# Patient Record
Sex: Female | Born: 1965 | Race: White | Hispanic: No | Marital: Married | Smoking: Former smoker
Health system: Southern US, Community
[De-identification: ages and names within clinical notes are randomized; demographics above are authoritative.]

## PROBLEM LIST (undated history)

## (undated) DIAGNOSIS — E119 Type 2 diabetes mellitus without complications: Secondary | ICD-10-CM

## (undated) DIAGNOSIS — G2582 Stiff-man syndrome: Principal | ICD-10-CM

## (undated) DIAGNOSIS — I10 Essential (primary) hypertension: Secondary | ICD-10-CM

## (undated) HISTORY — PX: APPENDECTOMY: SHX54

## (undated) HISTORY — DX: Stiff-man syndrome: G25.82

## (undated) HISTORY — PX: HERNIA REPAIR: SHX51

## (undated) HISTORY — PX: CHOLECYSTECTOMY: SHX55

---

## 1998-02-11 ENCOUNTER — Other Ambulatory Visit: Admission: RE | Admit: 1998-02-11 | Discharge: 1998-02-11 | Payer: Self-pay | Admitting: Obstetrics and Gynecology

## 2000-03-14 ENCOUNTER — Other Ambulatory Visit: Admission: RE | Admit: 2000-03-14 | Discharge: 2000-03-14 | Payer: Self-pay | Admitting: *Deleted

## 2001-03-14 ENCOUNTER — Other Ambulatory Visit: Admission: RE | Admit: 2001-03-14 | Discharge: 2001-03-14 | Payer: Self-pay | Admitting: Obstetrics and Gynecology

## 2002-03-23 ENCOUNTER — Other Ambulatory Visit: Admission: RE | Admit: 2002-03-23 | Discharge: 2002-03-23 | Payer: Self-pay | Admitting: Obstetrics and Gynecology

## 2002-03-31 ENCOUNTER — Encounter: Admission: RE | Admit: 2002-03-31 | Discharge: 2002-03-31 | Payer: Self-pay | Admitting: Obstetrics and Gynecology

## 2002-03-31 ENCOUNTER — Encounter: Payer: Self-pay | Admitting: Obstetrics and Gynecology

## 2003-03-26 ENCOUNTER — Other Ambulatory Visit: Admission: RE | Admit: 2003-03-26 | Discharge: 2003-03-26 | Payer: Self-pay | Admitting: Obstetrics and Gynecology

## 2005-12-12 ENCOUNTER — Encounter: Admission: RE | Admit: 2005-12-12 | Discharge: 2005-12-12 | Payer: Self-pay | Admitting: Obstetrics and Gynecology

## 2007-01-09 ENCOUNTER — Encounter: Admission: RE | Admit: 2007-01-09 | Discharge: 2007-01-09 | Payer: Self-pay | Admitting: Obstetrics and Gynecology

## 2008-01-15 ENCOUNTER — Encounter: Admission: RE | Admit: 2008-01-15 | Discharge: 2008-01-15 | Payer: Self-pay | Admitting: Obstetrics and Gynecology

## 2008-06-24 ENCOUNTER — Emergency Department (HOSPITAL_COMMUNITY): Admission: EM | Admit: 2008-06-24 | Discharge: 2008-06-25 | Payer: Self-pay | Admitting: Emergency Medicine

## 2008-06-25 ENCOUNTER — Encounter: Admission: RE | Admit: 2008-06-25 | Discharge: 2008-06-25 | Payer: Self-pay | Admitting: Family Medicine

## 2008-06-26 ENCOUNTER — Observation Stay (HOSPITAL_COMMUNITY): Admission: EM | Admit: 2008-06-26 | Discharge: 2008-06-27 | Payer: Self-pay | Admitting: Internal Medicine

## 2008-07-22 ENCOUNTER — Encounter (INDEPENDENT_AMBULATORY_CARE_PROVIDER_SITE_OTHER): Payer: Self-pay | Admitting: General Surgery

## 2008-07-22 ENCOUNTER — Ambulatory Visit (HOSPITAL_COMMUNITY): Admission: RE | Admit: 2008-07-22 | Discharge: 2008-07-22 | Payer: Self-pay | Admitting: General Surgery

## 2008-09-10 ENCOUNTER — Ambulatory Visit (HOSPITAL_COMMUNITY): Admission: EM | Admit: 2008-09-10 | Discharge: 2008-09-12 | Payer: Self-pay | Admitting: Emergency Medicine

## 2008-09-10 ENCOUNTER — Encounter (INDEPENDENT_AMBULATORY_CARE_PROVIDER_SITE_OTHER): Payer: Self-pay | Admitting: Surgery

## 2009-01-17 ENCOUNTER — Encounter: Admission: RE | Admit: 2009-01-17 | Discharge: 2009-01-17 | Payer: Self-pay | Admitting: Obstetrics and Gynecology

## 2009-05-24 ENCOUNTER — Inpatient Hospital Stay (HOSPITAL_COMMUNITY): Admission: AD | Admit: 2009-05-24 | Discharge: 2009-05-26 | Payer: Self-pay | Admitting: General Surgery

## 2009-05-24 ENCOUNTER — Ambulatory Visit: Payer: Self-pay | Admitting: Surgery

## 2009-05-24 ENCOUNTER — Encounter (INDEPENDENT_AMBULATORY_CARE_PROVIDER_SITE_OTHER): Payer: Self-pay | Admitting: General Surgery

## 2009-10-25 ENCOUNTER — Encounter: Admission: RE | Admit: 2009-10-25 | Discharge: 2009-11-21 | Payer: Self-pay | Admitting: Family Medicine

## 2010-04-24 ENCOUNTER — Encounter: Payer: Self-pay | Admitting: Internal Medicine

## 2010-04-24 ENCOUNTER — Emergency Department (HOSPITAL_COMMUNITY): Admission: EM | Admit: 2010-04-24 | Discharge: 2010-04-24 | Payer: Self-pay | Admitting: Emergency Medicine

## 2010-04-24 ENCOUNTER — Ambulatory Visit: Payer: Self-pay | Admitting: Cardiology

## 2010-05-04 ENCOUNTER — Encounter: Payer: Self-pay | Admitting: Internal Medicine

## 2010-05-30 ENCOUNTER — Telehealth (INDEPENDENT_AMBULATORY_CARE_PROVIDER_SITE_OTHER): Payer: Self-pay | Admitting: *Deleted

## 2010-06-21 ENCOUNTER — Ambulatory Visit: Payer: Self-pay | Admitting: Internal Medicine

## 2010-06-21 DIAGNOSIS — R9431 Abnormal electrocardiogram [ECG] [EKG]: Secondary | ICD-10-CM

## 2010-06-21 DIAGNOSIS — I10 Essential (primary) hypertension: Secondary | ICD-10-CM

## 2010-09-25 ENCOUNTER — Telehealth: Payer: Self-pay | Admitting: Internal Medicine

## 2010-10-03 NOTE — Letter (Signed)
Summary: [facility]   [facility]   Imported By: Roderic Ovens 07/14/2010 14:27:08  _____________________________________________________________________  External Attachment:    Type:   Image     Comment:   External Document

## 2010-10-03 NOTE — Consult Note (Signed)
Summary: [place]   [place]   Imported By: Roderic Ovens 07/14/2010 14:27:47  _____________________________________________________________________  External Attachment:    Type:   Image     Comment:   External Document

## 2010-10-03 NOTE — Assessment & Plan Note (Signed)
Summary: nep. tackycardia, pt has bcbs/ gd   Visit Type:  NP Primary Provider:  Dr. Paulino Rily  CC:  dizziness, light headed, and .  History of Present Illness: Ms. Bessinger is seen at the request of Dr. Paulino Rily because of elevated blood pressure and tachycardia.  The patient is a 45 year old woman who began having problems of tachycardia palpitations about a year ago. This followed a series of 3 abdominal surgeries include a laparoscopic cholecystectomy, laparoscopic appendectomy, and a ventral hernia/inguinal hernia repair. Since that time she's had problems with recurrent spasms resulting in significant pain difficulty with standing erect. It is with these spasms that she notes that her heart rate and blood pressure are elevated.  She has been treated and her currently with Valium with up titration to 5 mg a day which has been associated with a marked improvement. She has also been submitted to physical therapy which helped some.  The family brings in a notebook today with heart rates and blood pressures recorded at home over the last month or so; they are all without exception normal heart rate in the 60s and 70s and blood pressures in the 110-120 range.  The patient denies a history of syncope. She notes that she has less lightheadedness when she is.  She has significant stiffness following her spasm problem over the last year or so and this is the major limitation to her exercise tolerance.  Preventive Screening-Counseling & Management  Alcohol-Tobacco     Smoking Status: quit  Caffeine-Diet-Exercise     Does Patient Exercise: yes  Current Medications (verified): 1)  Zebeta 5 Mg Tabs (Bisoprolol Fumarate) .... 1/2 Tablet Daily 2)  Diazepam 5 Mg/ml Soln (Diazepam) .... Once Daily 3)  Vimovo 500-20 Mg Tbec (Naproxen-Esomeprazole) .... Once Daily 4)  One Daily For Women  Tabs (Multiple Vitamins-Minerals) .... Once Daily  Allergies (verified): No Known Drug Allergies  Past  History:  Past Medical History: Last updated: 06/20/2010 Abdominal hernia Sinus tachycardia HTN hepatitis  Family History: Last updated: 06/20/2010 Non-contributory  Social History: Last updated: 06/21/2010 Married  Tobacco Use - Former. -quit smoking in 2006 Alcohol Use - no Tobacco Use - Former.  Regular Exercise - yes  Past Surgical History: laparoscopic cholecystectomy laparoscopic appendectomy ventral hernia repair C-section  Social History: Married  Tobacco Use - Former. -quit smoking in 2006 Alcohol Use - no Tobacco Use - Former.  Regular Exercise - yes Does Patient Exercise:  yes  Review of Systems       full review of systems was negative apart from a history of present illness and past medical history except bursitis  Vital Signs:  Patient profile:   45 year old female Height:      65 inches Weight:      219.25 pounds BMI:     36.62 Pulse rate:   102 / minute BP sitting:   136 / 86  (left arm) Cuff size:   large  Vitals Entered By: Caralee Ates CMA (June 21, 2010 2:42 PM)  Physical Exam  General:  Alert and oriented middle aged Caucasian female appearing her stated age in no acute distress. HEENT  normal . Neck veins were flat; carotids brisk and full without bruits. No lymphadenopathy. Back without kyphosis. Lungs clear. Heart sounds regular without murmurs or gallops. PMI nondisplaced. Abdomen soft with active bowel sounds without midline pulsation or hepatomegaly. Femoral pulses and distal pulses intact. Extremities were without clubbing cyanosis or edemaSkin warm and dry. Neurological exam grossly normal, but she  ambulates with great difficulty and slowness affect is somewhat flat   Impression & Recommendations:  Problem # 1:  HYPERTENSION, BENIGN (ICD-401.1) this appears to be largely sympathetic driven associated with spasms as evidenced by the home recordings which are all normal. I have encouraged her to followup with her primary care  physician for further physical therapy and other things that might be done to help ameliorate her pain. This also holds true for the tachycardia. There is no evidence for an elective cardiogram that this represents an abnormal rhythm. Her updated medication list for this problem includes:    Zebeta 5 Mg Tabs (Bisoprolol fumarate) .Marland Kitchen... 1/2 tablet daily  Problem # 2:  ELECTROCARDIOGRAM, ABNORMAL (ICD-794.31) Head he said the above, however, I'm a little bit bothered by the abnormalities on her electrocardiogram and would suggest that we exclude coronary disease as a potential contributor to her exercise intolerance. This will need to be done with [facility] imaging as she is unable to ambulate. Her updated medication list for this problem includes:    Zebeta 5 Mg Tabs (Bisoprolol fumarate) .Marland Kitchen... 1/2 tablet daily  Appended Document: orders

## 2010-10-03 NOTE — Letter (Signed)
Summary: [facility]   [facility]   Imported By: Roderic Ovens 07/14/2010 14:26:50  _____________________________________________________________________  External Attachment:    Type:   Image     Comment:   External Document

## 2010-10-03 NOTE — Progress Notes (Signed)
  Records recieved from [facility] NP appt w/ Graciela Husbands 06/21/10 given to Simeon Craft [place]  May 30, 2010 9:38 AM

## 2010-10-05 NOTE — Progress Notes (Signed)
  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  she is getting a second opinion later this year and if it is different will notifiy [place].  Initial call taken by: Claris Gladden RN,  September 25, 2010 3:43 PM

## 2010-11-17 LAB — POCT I-STAT, CHEM 8
BUN: 16 mg/dL (ref 6–23)
Calcium, Ion: 1.04 mmol/L — ABNORMAL LOW (ref 1.12–1.32)
Chloride: 111 mEq/L (ref 96–112)
Creatinine, Ser: 0.8 mg/dL (ref 0.4–1.2)
Glucose, Bld: 112 mg/dL — ABNORMAL HIGH (ref 70–99)
Potassium: 4.2 mEq/L (ref 3.5–5.1)

## 2010-11-17 LAB — URINALYSIS, ROUTINE W REFLEX MICROSCOPIC
Bilirubin Urine: NEGATIVE
Leukocytes, UA: NEGATIVE
Nitrite: NEGATIVE
Protein, ur: 30 mg/dL — AB

## 2010-11-17 LAB — CBC
HCT: 38 % (ref 36.0–46.0)
Hemoglobin: 13.4 g/dL (ref 12.0–15.0)
MCHC: 35.3 g/dL (ref 30.0–36.0)
MCV: 85.7 fL (ref 78.0–100.0)
RDW: 13.5 % (ref 11.5–15.5)
WBC: 13.1 10*3/uL — ABNORMAL HIGH (ref 4.0–10.5)

## 2010-11-17 LAB — PROTIME-INR: Prothrombin Time: 14.2 seconds (ref 11.6–15.2)

## 2010-11-17 LAB — DIFFERENTIAL
Basophils Absolute: 0 10*3/uL (ref 0.0–0.1)
Eosinophils Relative: 1 % (ref 0–5)
Lymphocytes Relative: 12 % (ref 12–46)
Monocytes Absolute: 0.9 10*3/uL (ref 0.1–1.0)
Monocytes Relative: 7 % (ref 3–12)
Neutro Abs: 10.4 10*3/uL — ABNORMAL HIGH (ref 1.7–7.7)

## 2010-11-17 LAB — POCT CARDIAC MARKERS
CKMB, poc: 1.7 ng/mL (ref 1.0–8.0)
CKMB, poc: <1 ng/mL — ABNORMAL LOW (ref 1.0–8.0)
Myoglobin, poc: 149 ng/mL (ref 12–200)
Troponin i, poc: <0.05 ng/mL (ref 0.00–0.09)

## 2010-11-17 LAB — POCT PREGNANCY, URINE: Preg Test, Ur: NEGATIVE

## 2010-11-17 LAB — HEPATIC FUNCTION PANEL
AST: 23 U/L (ref 0–37)
Albumin: 4.6 g/dL (ref 3.5–5.2)
Alkaline Phosphatase: 65 U/L (ref 39–117)
Bilirubin, Direct: 0.2 mg/dL (ref 0.0–0.3)
Total Bilirubin: 0.8 mg/dL (ref 0.3–1.2)

## 2010-11-17 LAB — URINE MICROSCOPIC-ADD ON

## 2010-12-08 LAB — URINALYSIS, ROUTINE W REFLEX MICROSCOPIC
Glucose, UA: NEGATIVE mg/dL
pH: 5.5 (ref 5.0–8.0)

## 2010-12-08 LAB — BASIC METABOLIC PANEL
CO2: 27 mEq/L (ref 19–32)
Chloride: 106 mEq/L (ref 96–112)
Creatinine, Ser: 0.56 mg/dL (ref 0.4–1.2)
GFR calc Af Amer: >60 mL/min (ref >60)
Glucose, Bld: 112 mg/dL — ABNORMAL HIGH (ref 70–99)
Potassium: 3.9 mEq/L (ref 3.5–5.1)
Potassium: 3.9 mEq/L (ref 3.5–5.1)
Sodium: 138 mEq/L (ref 135–145)

## 2010-12-08 LAB — COMPREHENSIVE METABOLIC PANEL
AST: 29 U/L (ref 0–37)
CO2: 26 mEq/L (ref 19–32)
Calcium: 9.6 mg/dL (ref 8.4–10.5)
Creatinine, Ser: 0.64 mg/dL (ref 0.4–1.2)
GFR calc Af Amer: >60 mL/min (ref >60)
GFR calc non Af Amer: >60 mL/min (ref >60)
Sodium: 140 mEq/L (ref 135–145)
Total Protein: 7.7 g/dL (ref 6.0–8.3)

## 2010-12-08 LAB — DIFFERENTIAL
Eosinophils Absolute: 0.3 10*3/uL (ref 0.0–0.7)
Eosinophils Relative: 4 % (ref 0–5)
Lymphocytes Relative: 31 % (ref 12–46)
Lymphs Abs: 1.7 10*3/uL (ref 0.7–4.0)
Lymphs Abs: 2.2 10*3/uL (ref 0.7–4.0)
Monocytes Relative: 11 % (ref 3–12)
Monocytes Relative: 9 % (ref 3–12)
Neutro Abs: 5.7 10*3/uL (ref 1.7–7.7)
Neutrophils Relative %: 56 % (ref 43–77)
Neutrophils Relative %: 65 % (ref 43–77)

## 2010-12-08 LAB — CBC
MCHC: 34 g/dL (ref 30.0–36.0)
MCV: 89.1 fL (ref 78.0–100.0)
MCV: 89.1 fL (ref 78.0–100.0)
Platelets: 306 10*3/uL (ref 150–400)
RBC: 3.59 MIL/uL — ABNORMAL LOW (ref 3.87–5.11)
RBC: 4.27 MIL/uL (ref 3.87–5.11)
RDW: 12.9 % (ref 11.5–15.5)
WBC: 8.9 10*3/uL (ref 4.0–10.5)

## 2010-12-08 LAB — URINE MICROSCOPIC-ADD ON

## 2010-12-18 LAB — CBC
HCT: 38.9 % (ref 36.0–46.0)
Hemoglobin: 13.1 g/dL (ref 12.0–15.0)
MCHC: 33.8 g/dL (ref 30.0–36.0)
MCV: 89.6 fL (ref 78.0–100.0)
RDW: 12.9 % (ref 11.5–15.5)

## 2010-12-18 LAB — DIFFERENTIAL
Basophils Absolute: 0 10*3/uL (ref 0.0–0.1)
Basophils Relative: 0 % (ref 0–1)
Eosinophils Relative: 0 % (ref 0–5)
Monocytes Absolute: 1.1 10*3/uL — ABNORMAL HIGH (ref 0.1–1.0)

## 2010-12-18 LAB — URINALYSIS, ROUTINE W REFLEX MICROSCOPIC
Bilirubin Urine: NEGATIVE
Protein, ur: NEGATIVE mg/dL
Urobilinogen, UA: 0.2 mg/dL (ref 0.0–1.0)

## 2010-12-18 LAB — URINE MICROSCOPIC-ADD ON

## 2010-12-18 LAB — PROTIME-INR: INR: 1.2 (ref 0.00–1.49)

## 2010-12-18 LAB — APTT: aPTT: 33 seconds (ref 24–37)

## 2011-01-16 NOTE — Op Note (Signed)
Margaret Boyd, Margaret Boyd              ACCOUNT NO.:  [id number]   MEDICAL RECORD NO.:  [id number]          PATIENT TYPE:  AMB   LOCATION:  SDS                          FACILITY:  [facility]   PHYSICIAN:  Juanetta Gosling, MDDATE OF BIRTH:  1966-08-09   DATE OF PROCEDURE:  07/22/2008  DATE OF DISCHARGE:  07/22/2008                               OPERATIVE REPORT   PREOPERATIVE DIAGNOSES:  1. Episode of gallstone pancreatitis.  2. Symptomatic cholelithiasis.   POSTOPERATIVE DIAGNOSIS:  Acute cholecystitis.   PROCEDURE:  Laparoscopic cholecystectomy.   SURGEON:  Troy Sine. Dwain Sarna, MD   ASSISTANT:  Ms. Magnus Ivan.   ANESTHESIA:  General.   FINDINGS:  Unable to cannulate cystic duct due to size and was not able  to perform a cholangiogram safely.   SPECIMENS:  Gallbladder contents to pathology.   ESTIMATED BLOOD LOSS:  Minimal.   COMPLICATIONS:  None.   DRAINS:  None.   DISPOSITION:  To PACU in a stable condition.   INDICATIONS:  Margaret Boyd is a 45 year old female with a history of  epigastric pain, evaluated by her primary care physician and found to  have an elevated lipase.  She was admitted after this outpatient  evaluation.  Underwent a CT scan that was essentially normal.  An  ultrasound that showed cholelithiasis with a mildly thickened  gallbladder wall, multiple shadow with gallstones that are small.  No  pericholecystic fluid.  Common duct was 4.2 mm.  Her LFTs were mildly  elevated with an AST of 72 and ALT of 389.  Normal total bilirubin and a  normal alkaline phosphatase.  She was discharged and was sent to see me  in clinic.  I counseled her for a laparoscopic cholecystectomy with a  cholangiogram.   PROCEDURE:  After informed consent was obtained, the patient was taken  to the operating room.  She was administered 1 g of cefoxitin.  Sequential compression devices were placed over the lower extremities.  She then underwent general endotracheal anesthesia  without complication.  Her abdomen was then prepped and draped in a standard sterile surgical  fashion.  Surgical time-out was then performed.   A 10-mm vertical infraumbilical incision was then made and dissection  carried out down to the level of the fascia.  This was then entered  sharply and the peritoneum was then entered bluntly.  There was no  evidence of injury upon entry.  A Hasson trocar was then inserted after  a Vicryl pursestring suture was placed through the fascia.  The abdomen  was insufflated to 15 mmHg pressure, which she tolerated well.  Three  further 5-mm trocars were inserted into the epigastrium and the right  upper quadrant after infiltration with local anesthetic under direct  vision without complication.  Following this, the gallbladder was then  retracted cephalad.  There was some omentum that was adherent to the  gallbladder that was dissected bluntly and the gallbladder was then  grasped lower down and retracted lateral as well as cephalad.  The  triangle of Calot was then dissected.  There was a fair amount of  scarring  located in the triangle of Calot.  The hook cautery was used to  release both medial and lateral and the gallbladder.  There was a rind  around the wall that was quite thick.  Following this, a [place] was then used to dissect the triangle.  The cystic duct and  cystic artery were both clearly identified entering the gallbladder with  liver on either side of those obtaining the critical view of safety.  Following this, I placed a clip distally on the cystic duct and then  made a ductotomy with the scissors.  I put the tip of the hook cautery  in there and I obtained a small amount of bilious material.  I then  placed a Cook catheter inside the abdomen and tried to put this inside  the cystic duct and was unable to.  I tried to milk some of stones out  of the cystic duct, but there were none present.  I could clearly see  the  cystic-common duct junction as well as the aforementioned critical  view of safety.  I tried numerous times to cannulate the cystic duct  also to milk out any material that might have been in the cystic duct  but was unable to do this.  I eventually thought that there was no play  I could make a hole further down as I would be begin to get closer to  the common duct.  I elected at this point since I obtained a critical  view of safety to put 3 clips proximally and divide the cystic duct,  which I did without complication.  I then put 3 clips on the cystic  artery, cut this, and then began to remove the gallbladder from the  liver bed with electrocautery.  This had a very watery plane stuck in at  some places to the gallbladder bed.  I made one entry into the liver,  which I controlled with electrocautery.  The gallbladder was then  removed from the liver bed.  The 5-mm camera was inserted in the  epigastrium and the EndoCatch was inserted into the umbilical port.  The  gallbladder was placed in the EndoCatch and removed and passed off the  table as a specimen.  The Hasson trocar was then reinserted.  The 10-mm  camera was reinserted.  The area was copiously irrigated.  Hemostasis  was observed.  All clips were in good position.  I then removed all the  trocars under direct vision and desufflated the abdomen.  I placed one  extra 0 Vicryl stitch in her fascia at her umbilicus and then all skin  incisions were closed with a 4-0 Monocryl in a subcuticular fashion.  Dermabond was then placed over the wound.  She was extubated in the  operating room and transferred to the PACU in a stable condition.      Juanetta Gosling, MD  Electronically Signed     MCW/MEDQ  D:  07/22/2008  T:  07/23/2008  Job:  [id number]   cc:   Emeterio Reeve, MD

## 2011-01-16 NOTE — H&P (Signed)
[name], Margaret NO.:  [id number]   MEDICAL RECORD NO.:  [id number]          PATIENT TYPE:  INP   LOCATION:  5012                         FACILITY:  [facility]   PHYSICIAN:  Corinna L. Lendell Caprice, MDDATE OF BIRTH:  1966/04/19   DATE OF ADMISSION:  06/26/2008  DATE OF DISCHARGE:                              HISTORY & PHYSICAL   CHIEF COMPLAINT:  Abdominal pain.   HISTORY OF PRESENT ILLNESS:  Margaret Boyd is a pleasant 45 year old white  female who saw Dr. Paulino Rily for the first time over the past week for  epigastric pain.  She reports that she has experienced mainly nighttime  epigastric pain that radiates to her back for the past several weeks.  Her pain was so bad that she came to the Emergency Room at [facility]  on Thursday, but left prior to being evaluated due to a long wait time.  The patient reports that she has loose stool in the mornings.  No  steatorrhea.  She has had a poor appetite occasionally.  She has not had  any nausea or vomiting , however.  She was seen by Dr. Paulino Rily several  days ago and had a lipase drawn, which was 1722.  This was on June 24, 2008.  She also had an abdominal ultrasound at [facility] on  June 25, 2008 which showed cholelithiasis and mildly thickened  gallbladder wall, no pericholecystic fluid or Murphy sign, slight  increase in liver echogenicity, normal kidneys, normal common duct  diameter and the pancreas appeared normal.  Dr. Paulino Rily contacted Dr.  Janee Morn today for direct admission to [facility].  The patient  denies any jaundice.  Currently, she has no pain or nausea.  She reports  that it is usually at night time and lasts for 5 hours or so.  She has  tried Tums, Maalox, ex-lax without any relief of her pain.  It is  unrelated to eating.  It is described as pressure/boring-type pain, but  no burning or reflux symptoms.  She has had no shortness of breath.  No  cough.  She feels bloated when these  experiences occur.  She also has  been going to a [facility] called [facility] in [place].  She does not recall the names of the supplements and medications, but  describes what may be phendimetrazine which is a sympathomimetic and  pharmacologically related to amphetamine according to the PDR.  I have  also looked on the website for these mediclinics and it sounds as if she  is on one of their fat burner supplements.  The ingredients are not  listed.  She is also on calcium product, which according to their  website contains calcium, magnesium, and vitamin D3.  She has been on  these medications for about 5 months and has lost about 50 pounds.   PAST MEDICAL HISTORY:  None.   MEDICATIONS:  She was recently started back on Depo-Provera for severe  premenstrual clamping and pain.  Also as above.   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  She is married.  She has 2 daughters, age 49 and 81.  She does not drink  heavily.  She does not use drugs.  She does not smoke.   FAMILY HISTORY:  She has 2 healthy living parents and her siblings are  without any medical problems.   PAST SURGICAL HISTORY:  C-section.   REVIEW OF SYSTEMS:  As above, otherwise negative.   PHYSICAL EXAMINATION:  VITAL SIGNS:  Her vital signs have not yet been  entered.  Please see nursing notes.  GENERAL:  The patient is well nourished, well developed in no acute  distress.  HEENT:  Normocephalic and atraumatic.  Pupils are equal, round, and  reactive to light.  Sclerae are nonicteric.  Moist mucous membranes.  NECK:  Supple.  No lymphadenopathy.  LUNGS:  Clear to auscultation bilaterally without wheezes, rhonchi, or  rales.  CARDIOVASCULAR:  Regular rate and rhythm without murmurs, gallops, or  rubs.  ABDOMEN:  Soft, mild epigastric tenderness, but no rebound tenderness.  No guarding.  BACK:  No CVA tenderness.  EXTREMITIES:  No clubbing, cyanosis, or edema.   SKIN:  No rash.  No jaundice.  PSYCHIATRIC:  Normal affect.  NEUROLOGIC:  Alert and oriented.  Cranial nerves and sensorimotor exam  are intact.   LABS:  Lipase from 2 days ago was 1722.  CBC today is unremarkable.  Complete metabolic panel today is significant for an SGOT of 154, SGPT  of 601, otherwise normal bilirubin and alkaline phosphatase.  Her  amylase is 70 and lipase is pending.  Ultrasound as above.   ASSESSMENT AND PLAN:  1. Pancreatitis with elevated transaminases:  I suspect she may have      had gallstone pancreatitis that is possibly improving.  The      ultrasound did show cholelithiasis, but no common duct stone or      dilatation.  She also had no description of pancreas edema on the      ultrasound.  I will await results of the lipase.  I have also asked      that the husband bring in her weight loss medications and      supplements as this may be a causative agent as well.  The patient      for now will be on a low-fat diet.  I will at this time get a CT of      the abdomen and pelvis to further evaluate.  I will also check a      hepatitis screen and urine drug screen as well as acetaminophen      level.  I will also check fasting triglyceride level.  2. Increased liver function test, see above.  This will be monitored      with serial laboratories and CAT scan.  If her liver function test      remain elevated and if the cause is elusive, she may need further      workup like autoimmune workup, etc.      Corinna L. Lendell Caprice, MD  Electronically Signed     CLS/MEDQ  D:  06/26/2008  T:  06/27/2008  Job:  [id number]   cc:   Emeterio Reeve, MD

## 2011-01-16 NOTE — Op Note (Signed)
NAMEQUETZALI, HEINLE NO.:  [id number]   MEDICAL RECORD NO.:  [id number]          PATIENT TYPE:  INP   LOCATION:  1519                         FACILITY:  [facility]   PHYSICIAN:  Velora Heckler, MD      DATE OF BIRTH:  29-Mar-1966   DATE OF PROCEDURE:  09/10/2008  DATE OF DISCHARGE:                               OPERATIVE REPORT   PREOPERATIVE DIAGNOSIS:  Acute appendicitis.   POSTOPERATIVE DIAGNOSIS:  Acute appendicitis.   PROCEDURE:  Laparoscopic appendectomy.   SURGEON:  Velora Heckler, MD, FACS   ANESTHESIA:  General per Dr. Lestine Box   ESTIMATED BLOOD LOSS:  Minimal.   PREPARATION:  Betadine.   COMPLICATIONS:  None.   INDICATIONS:  The patient is a 45 year old white female from  [place], [place] who presents to the emergency department  with a 24-hour history of abdominal pain localizing to the right lower  quadrant associated with nausea and vomiting.  Laboratory studies showed  an elevated white blood cell count of 17,000.  CT scan abdomen and  pelvis showed findings consistent with acute appendicitis.  The patient  is prepared and brought to the operating room for appendectomy.   BODY OF REPORT:  Procedure is done in OR #1 at the [facility]  The patient is brought to the operating room, placed in  supine position on the operating room table.  Following administration  of general anesthesia, the patient is prepped and draped in usual strict  aseptic fashion.  After ascertaining that an adequate level of  anesthesia had been achieved, a supraumbilical incision is made in the  midline with a #15 blade.  Dissection was carried down to the fascia.  Fascia is incised in the midline and the peritoneal cavity was entered  cautiously and 0 Zero Vicryl pursestring suture was placed on the  fascia.  An Hasson cannula was introduced under direct vision and  secured with a pursestring suture.  Abdomen was insufflated with  carbon  dioxide.  Laparoscope was introduced and the abdomen explored.  There  are adhesions to the previous umbilical incision.  Operative port was  placed in the right upper quadrant.  Adhesions were taken down with the  harmonic scalpel.  Using a Glassman clamp, the right colon is mobilized.  Terminal ileum appears normal.  Colon appears normal.  Appendix was  quite long and distended.  It lies in the lateral colic gutter in a  partially retrocecal location.  Operative port was placed in a  subxiphoid position.  Appendix was elevated and, using the harmonic  scalpel, the peritoneum was incised.  Mesoappendix was divided with the  harmonic scalpel.  Dissection was carefully carried down to the base of  the appendix which was then transected with an Endo-GIA stapler.  Staple  line shows good hemostasis.  The appendix was placed into an EndoCatch  bag and withdrawn through the umbilical port without difficulty and 0  Vicryl pursestring sutures tied securely.  Staple line was again  inspected and good hemostasis was noted.  Good hemostasis was noted  along  the lateral colic gutter.  No evidence of abscess or perforation  was identified.  Pneumoperitoneum was released and ports were removed  under direct vision.  Port sites were anesthetized with local  anesthetic.  All  wounds were closed with interrupted 4-0 Vicryl subcuticular sutures.  Wounds were washed and dried and Benzoin and Steri-Strips were applied.  Sterile dressings were applied.  The patient is awakened from anesthesia  and brought to the recovery room in stable condition.  The patient  tolerated the procedure well.      Velora Heckler, MD  Electronically Signed     TMG/MEDQ  D:  09/10/2008  T:  09/11/2008  Job:  [id number]   cc:   Neta Mends. Fabian Sharp, MD  [address] [place], [place] [address]

## 2011-01-16 NOTE — Discharge Summary (Signed)
Margaret Boyd, Margaret Boyd              ACCOUNT NO.:  [id number]   MEDICAL RECORD NO.:  [id number]          PATIENT TYPE:  INP   LOCATION:  1519                         FACILITY:  [facility]   PHYSICIAN:  Velora Heckler, MD      DATE OF BIRTH:  [birth year]   DATE OF ADMISSION:  09/10/2008  DATE OF DISCHARGE:  09/12/2008                               DISCHARGE SUMMARY   REASON FOR ADMISSION:  Acute appendicitis.   HISTORY OF PRESENT ILLNESS:  Margaret Boyd is a 6-year year-old white  female from [place], [place].  She presented to the emergency  room with abdominal pain, nausea, vomiting and chills.  Laboratory  studies showed an elevated white blood cell count of 17,000.  CT scan  abdomen and pelvis at [facility] showed findings  consistent with acute appendicitis.  General surgery was called and the  patient was admitted for management.   HOSPITAL COURSE:  The patient was admitted on January 8 to the general  surgical service.  She was prepared and taken immediately to the  operating room where she underwent laparoscopic appendectomy.  Postoperative course was uncomplicated.  She received 24 hours of  intravenous antibiotics.  She her diet was advanced from clear liquids  to a regular diet.  She is prepared for discharge home on the second  postoperative day.   DISCHARGE/PLAN:  The patient is discharged home on September 12, 2008.  She is tolerating regular diet.  She is ambulating independently.   DISCHARGE MEDICATIONS:  Include Vicodin as needed for pain.   FOLLOWUP:  The patient will be seen back in my office at [facility] in 2 to 3 weeks for wound check.   FINAL DIAGNOSIS:  Acute appendicitis.   CONDITION ON DISCHARGE:  Improved.      Velora Heckler, MD  Electronically Signed     TMG/MEDQ  D:  09/12/2008  T:  09/12/2008  Job:  [id number]   cc:   Juanetta Gosling, MD  [address]  [place] [address]

## 2011-01-16 NOTE — H&P (Signed)
NAMESHANTERRIA, FRANTA NO.:  [id number]   MEDICAL RECORD NO.:  [id number]          PATIENT TYPE:  INP   LOCATION:  1519                         FACILITY:  [facility]   PHYSICIAN:  Velora Heckler, MD      DATE OF BIRTH:  07-26-1966   DATE OF ADMISSION:  09/10/2008  DATE OF DISCHARGE:                              HISTORY & PHYSICAL   REFERRING PHYSICIAN:  Emergency Department, [facility].   CHIEF COMPLAINT:  Abdominal pain, nausea, vomiting and chills; CT scan  showing acute appendicitis.   HISTORY OF PRESENT ILLNESS:  Margaret Boyd is a 45 year old white  female from [place], [place].  She presents to the emergency  room with 24-hour history of abdominal pain localizing to the right  lower quadrant associated with nausea, vomiting and chills.  The patient  was seen earlier today by Dr. Emelia Loron at our office.  Laboratory studies were obtained and showed an elevated white blood cell  count of 17,000 with left shift with 89% segmented neutrophils.  CT scan  abdomen and pelvis was obtained at [facility]  demonstrating findings consistent with acute appendicitis.  Patient is  now seen by general surgery in preparation for appendectomy.   PAST MEDICAL HISTORY:  1. Status post laparoscopic cholecystectomy November 2009.  2. Status post cesarean section.   MEDICATIONS:  None.   ALLERGIES:  None known.   SOCIAL HISTORY:  The patient is married.  She is a Futures trader.  She has  two children.  She quit smoking 3 years ago.  She denies alcohol use.   FAMILY HISTORY:  Noncontributory.   A 15-system review documented in the medical record without significant  other findings except as noted above.   EXAM:  A 45 year old well-developed, well-nourished white female on a  stretcher in the hallway in the Emergency Department at [facility].  Temperature 97.7, pulse 67, respirations 16, blood  pressure 120/71.  HEENT:  Shows her to be normocephalic/atraumatic, sclerae clear,  conjunctiva clear, dentition fair.  Mucous membranes moist.  Voice  normal.  Palpation of the neck shows no thyroid nodularity, no  lymphadenopathy, no mass, no tenderness.  LUNGS:  Clear to auscultation bilaterally without rales, rhonchi or  wheeze.  CARDIAC:  Shows regular rate and rhythm without significant murmur.  Peripheral pulses are full.  EXTREMITIES:  Nontender without edema.  ABDOMEN:  Soft without distention.  Well-healed surgical wounds.  There  are bowel sounds on auscultation.  There is tenderness to palpation and  percussion particularly in the right lower quadrant.  There is voluntary  guarding.  There is no palpable mass.  There is no sign of hernia.  NEUROLOGICALLY:  The patient is alert and oriented.  No focal deficits.  No sign of tremor.   LABORATORY STUDIES:  White count 17.2, hemoglobin 13.1, hematocrit  38.9%, platelet count 272,000, neutrophils 89%, lymphocytes 5%,  monocytes 6%.  Urine pregnancy test is negative.  Urinalysis is benign.  Chemistry profile is unremarkable with the exception of an elevated  total bilirubin of 3.1.  RADIOGRAPHIC STUDIES:  CT scan abdomen and pelvis with findings  consistent with acute appendicitis.   IMPRESSION:  Acute appendicitis.   PLAN:  The patient is admitted under general surgical service.  Intravenous Unasyn has been administered in the emergency department.  The patient is being prepared to be taken directly to the operating room  for laparoscopic appendectomy.  Risks and benefits of the procedure are  discussed.  Risk of conversion to open surgery has been reviewed with  the patient.  She understands and wishes to proceed.      Velora Heckler, MD  Electronically Signed     TMG/MEDQ  D:  09/10/2008  T:  09/11/2008  Job:  [id number]   cc:   Juanetta Gosling, MD  [address]  [place] [address]

## 2011-01-16 NOTE — Discharge Summary (Signed)
[name], [name] NO.:  [id number]   MEDICAL RECORD NO.:  [id number]          PATIENT TYPE:  INP   LOCATION:  5012                         FACILITY:  [facility]   PHYSICIAN:  Corinna L. Lendell Caprice, MDDATE OF BIRTH:  1966/08/31   DATE OF ADMISSION:  06/26/2008  DATE OF DISCHARGE:  06/27/2008                               DISCHARGE SUMMARY   REFERRING PHYSICIAN:  Jasmine December A. Paulino Rily, MD   DISCHARGE DIAGNOSES:  1. Pancreatitis, resolved, suspect biliary origin.  2. Increased LFTs, suspect secondary to passed stone, needs outpatient      followup liver function tests within a week or two.   DISCHARGE MEDICATIONS:  1. Dilaudid 2 mg p.o. q.4 h. p.r.n. pain, 10 were dispensed.  2. Avoid Tylenol products until cleared by Dr. Paulino Rily.  3. Stop her fat burner and Vita-Super supplements.  4. Stop phendimetrazine.  5. Continue Depo-Provera shots.   FOLLOWUP:  Follow up with [facility], call for an  appointment to evaluate for elective cholecystectomy.  Follow up with  Dr. Paulino Rily in 1-2 weeks to recheck her liver function tests and follow  up results of the hepatitis screen.   CONDITION:  Stable.   DIET:  Low-fat.   ACTIVITY:  Ad lib.   CONSULTATIONS:  None.   PROCEDURES:  None.   LABORATORY DATA:  Outpatient lipase on June 24, 2008 was 1722.  Amylase and lipase on admission and the day of discharge were normal.  CBC normal.  PT/PTT normal.  Complete metabolic panel significant for a  normal bilirubin, normal alkaline phosphatase, SGOT of 154, SGPT of 601.  The following day her SGOT had decreased to 71, SGPT had decreased to  389.  Triglycerides 91, LDL 91, total cholesterol 045, and HDL 30.  Urine drug screen negative.  Urinalysis showed 15 ketones, otherwise  negative.   SPECIAL STUDIES/RADIOLOGY:  Ultrasound of the abdomen done as an  outpatient on June 25, 2008 showed cholelithiasis with mildly  thickened gallbladder wall, no  pericholecystic fluid or Murphy sign,  slight increase in liver echogenicity, and no pancreas abnormalities  mentioned.  CT of the abdomen done during hospitalization showed normal  pancreas.  No ductal dilatation.  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   HISTORY AND HOSPITAL COURSE:  Margaret Boyd is a pleasant 45 year old white  female who was directly admitted to the hospital for pancreatitis.  She  had been having epigastric pain that radiated to her back, almost  nightly for the past several weeks.  She went to see Dr. Paulino Rily for the  first time and a lipase was drawn which was abnormal.  I do not see any  outpatient labs or any other tests were done, but I do not have specific  details.  The patient also had presented to the emergency room at [facility], but left prior to being evaluated because the wait was too long.  She had an abdominal ultrasound ordered by Dr. Paulino Rily and done as an  outpatient, results as above.  So, she was admitted  with presumed  gallstone pancreatitis.  On the day of admission, she had no abdominal  pain.  She did have some slight epigastric tenderness but this was very  minimal.  She had no guarding and her abdomen was soft.  Upon repeat  labs, really the only abnormality found was increased liver function  tests.  Her CAT scan also was normal.  A hepatitis screen is at this  time pending, but I suspect this may all be due to an obstruction from  stone which has since passed.  I have therefore recommended that she  follow up with [facility] to evaluate for elective  laparoscopic cholecystectomy.   The patient also has been going to a Weight Loss Clinic in [place]  which is closed today, but is on phendimetrazine which is a  sympathomimetic and also a fat burner and Vita-Super which have  various herbal and protein supplements.  I have recommended that she  stop all three of these.  I have also  recommended that she call the  [facility] Clinic tomorrow to discuss potential side effects of her  treatment regimen with the physician in charge there.  My concern is  that some of her symptoms may have been caused by her diet change and  these medications and supplements.  The patient initially was admitted  as an inpatient but subsequently switched to observation status.      Corinna L. Lendell Caprice, MD  Electronically Signed     CLS/MEDQ  D:  06/27/2008  T:  06/27/2008  Job:  [id number]   cc:   Emeterio Reeve, MD  Lennie Muckle, MD

## 2011-05-09 ENCOUNTER — Ambulatory Visit
Admission: RE | Admit: 2011-05-09 | Discharge: 2011-05-09 | Disposition: A | Discharge status: Home / Self Care | Payer: BC Managed Care – PPO | Source: Ambulatory Visit | Attending: Family Medicine | Admitting: Family Medicine

## 2011-05-09 ENCOUNTER — Other Ambulatory Visit: Payer: Self-pay | Admitting: Family Medicine

## 2011-05-09 DIAGNOSIS — S20219A Contusion of unspecified front wall of thorax, initial encounter: Secondary | ICD-10-CM

## 2011-06-05 LAB — DIFFERENTIAL
Basophils Relative: 1
Eosinophils Absolute: 0.2
Eosinophils Relative: 4
Lymphs Abs: 2.4
Monocytes Absolute: 0.6
Monocytes Relative: 10
Neutrophils Relative %: 43

## 2011-06-05 LAB — CBC
Hemoglobin: 12.1
Hemoglobin: 13.1
MCHC: 33.9
Platelets: 240
RBC: 3.98
RDW: 12.6
RDW: 12.4

## 2011-06-05 LAB — URINE CULTURE: Colony Count: 25000

## 2011-06-05 LAB — LIPID PANEL
Cholesterol: 139
HDL: 30 — ABNORMAL LOW
LDL Cholesterol: 91
Triglycerides: 91

## 2011-06-05 LAB — COMPREHENSIVE METABOLIC PANEL
ALT: 601 — ABNORMAL HIGH
AST: 154 — ABNORMAL HIGH
Albumin: 3.8
Albumin: 4.4
Alkaline Phosphatase: 77
BUN: 8
Calcium: 9.2
Creatinine, Ser: 0.69
GFR calc Af Amer: >60
Glucose, Bld: 88
Glucose, Bld: 92
Potassium: 4.1
Sodium: 141
Total Protein: 6.4
Total Protein: 7.4

## 2011-06-05 LAB — BASIC METABOLIC PANEL
BUN: 13
CO2: 25
Calcium: 9.6
Creatinine, Ser: 0.61
Glucose, Bld: 88
Sodium: 139

## 2011-06-05 LAB — URINALYSIS, ROUTINE W REFLEX MICROSCOPIC
Ketones, ur: 15 — AB
Nitrite: NEGATIVE
Protein, ur: NEGATIVE

## 2011-06-05 LAB — AMYLASE
Amylase: 67
Amylase: 70

## 2011-06-05 LAB — URINE DRUGS OF ABUSE SCREEN W ALC, ROUTINE (REF LAB)
Amphetamine Screen, Ur: NEGATIVE
Barbiturate Quant, Ur: NEGATIVE
Cocaine Metabolites: NEGATIVE
Creatinine,U: 151.8
Ethyl Alcohol: <10
Methadone: NEGATIVE
Phencyclidine (PCP): NEGATIVE

## 2011-06-05 LAB — HEPATIC FUNCTION PANEL
Albumin: 4.2
Total Bilirubin: 1.1

## 2011-06-05 LAB — LIPASE, BLOOD: Lipase: 33

## 2012-06-02 ENCOUNTER — Emergency Department (HOSPITAL_COMMUNITY): Payer: BC Managed Care – PPO

## 2012-06-02 ENCOUNTER — Emergency Department (HOSPITAL_COMMUNITY)
Admission: EM | Admit: 2012-06-02 | Discharge: 2012-06-02 | Disposition: A | Discharge status: Home / Self Care | Payer: BC Managed Care – PPO | Attending: Emergency Medicine | Admitting: Emergency Medicine

## 2012-06-02 ENCOUNTER — Encounter (HOSPITAL_COMMUNITY): Payer: Self-pay | Admitting: Emergency Medicine

## 2012-06-02 DIAGNOSIS — S2242XA Multiple fractures of ribs, left side, initial encounter for closed fracture: Secondary | ICD-10-CM

## 2012-06-02 DIAGNOSIS — S0101XA Laceration without foreign body of scalp, initial encounter: Secondary | ICD-10-CM

## 2012-06-02 DIAGNOSIS — S2249XA Multiple fractures of ribs, unspecified side, initial encounter for closed fracture: Secondary | ICD-10-CM | POA: Insufficient documentation

## 2012-06-02 DIAGNOSIS — S0100XA Unspecified open wound of scalp, initial encounter: Secondary | ICD-10-CM | POA: Insufficient documentation

## 2012-06-02 DIAGNOSIS — Z87891 Personal history of nicotine dependence: Secondary | ICD-10-CM | POA: Insufficient documentation

## 2012-06-02 DIAGNOSIS — Y92009 Unspecified place in unspecified non-institutional (private) residence as the place of occurrence of the external cause: Secondary | ICD-10-CM | POA: Insufficient documentation

## 2012-06-02 DIAGNOSIS — W19XXXA Unspecified fall, initial encounter: Secondary | ICD-10-CM

## 2012-06-02 DIAGNOSIS — I10 Essential (primary) hypertension: Secondary | ICD-10-CM | POA: Insufficient documentation

## 2012-06-02 HISTORY — DX: Essential (primary) hypertension: I10

## 2012-06-02 HISTORY — DX: Stiff-man syndrome: G25.82

## 2012-06-02 MED ORDER — OXYCODONE-ACETAMINOPHEN 5-325 MG PO TABS: 1.0000 | ORAL_TABLET | Freq: Four times a day (QID) | ORAL | Status: DC | PRN

## 2012-06-02 MED ORDER — OXYCODONE-ACETAMINOPHEN 5-325 MG PO TABS
1.0000 | ORAL_TABLET | Freq: Once | ORAL | Status: AC
  Administered 2012-06-02: 1 via ORAL
  Filled 2012-06-02: qty 1

## 2012-06-02 NOTE — ED Notes (Signed)
Patient transported to X-ray 

## 2012-06-02 NOTE — ED Notes (Signed)
Ambulated with assistance-"stiff" gait related to history, otherwise no complaints

## 2012-06-02 NOTE — ED Notes (Signed)
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 She fell back hitting her head. Pt denies dizziness, LOC and states that it just happens with her disease. Pt A&Ox4, NAD. VS WNL.

## 2012-06-02 NOTE — ED Notes (Signed)
Pt in via [facility]. Per EMS pt here with fall, lac of head. 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 Pt fell backwards hitting head on floor. No LOC. Pt ambulatory. Pt has approx 1 in lac to back of head.

## 2012-06-02 NOTE — ED Provider Notes (Signed)
History     CSN: [id number]  Arrival date & time 06/02/12  4098   First MD Initiated Contact with Patient 06/02/12 1001      Chief Complaint  Patient presents with  . Fall  . 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  Pt reports she was using her scale to weight herself this AM, and when she stepped off she accidentally tripped over a box near the scale which caused her to fall backward hitting her head against tile floor.  She denies LOC, denies precipitating sxs.  Sts with her syndrome her body usually stiffen when she falls.  She is currently complaining of sharp throbbing pain to back of head and also pain to her L ribs which she injured last week from falling.  Pt does not think she have broken any bone and does not want head CT.  She also denies numbness or weakness.  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  She is UTD with tetanus.  She usually takes diazepam for her syndrome.    Past Medical History  Diagnosis Date  . Stiffman syndrome   . Hypertension     Past Surgical History  Procedure Date  . Cholecystectomy   . Appendectomy   . Hernia repair   . Cesarean section     No family history on file.  History  Substance Use Topics  . Smoking status: Former Games developer  . Smokeless tobacco: Not on file  . Alcohol Use: No    OB History    Grav Para Term Preterm Abortions TAB SAB Ect Mult Living                  Review of Systems  Constitutional: Negative for fever and activity change.  Skin: Positive for wound.  Neurological: Negative for numbness.  All other systems reviewed and are negative.    Allergies  Review of patient's allergies indicates no known allergies.  Home Medications  No current outpatient prescriptions on file.  BP  140/87  Pulse 81  Temp 98.7 F (37.1 C) (Oral)  Resp 20  Ht 5\' 5"  (1.651 m)  Wt 230 lb (104.327 kg)  BMI 38.27 kg/m2  SpO2 95%  Physical Exam  Nursing note and vitals reviewed. Constitutional: She is oriented to person, place, and time. She appears well-developed and well-nourished. No distress.  HENT:  Head: Normocephalic.  Right Ear: External ear normal.  Left Ear: External ear normal.       2cm scalp laceration to L parietal region with moderate swelling and ttp.  No obvious deformity.  Not actively bleeding.    No midface tenderness, no hemotympanum, no septal hematoma, no malocclusion.  Eyes: Conjunctivae normal and EOM are normal. Pupils are equal, round, and reactive to light.  Neck: Normal range of motion. Neck supple.  Cardiovascular: Normal rate and regular rhythm.   Pulmonary/Chest: Effort normal and breath sounds normal.       L side of chest ttp diffusedly without crepitus, or deformity.  A small old bruise noted to L lateral aspect of chest, ttp.    Abdominal: Soft. There is no tenderness.  Musculoskeletal: Normal range of motion. She exhibits no edema and no tenderness.       Right shoulder: Normal.       Left shoulder: Normal.  Cervical back: Normal.       Thoracic back: Normal.       Lumbar back: Normal.  Neurological: She is alert and oriented to person, place, and time.  Skin: Skin is warm.  Psychiatric: She has a normal mood and affect.    ED Course  Procedures (including critical care time)  Results for orders placed during the hospital encounter of 04/24/10  URINALYSIS, ROUTINE W REFLEX MICROSCOPIC      Component Value Range   Color, Urine YELLOW  YELLOW   APPearance CLEAR  CLEAR   Specific Gravity, Urine 1.026  1.005 - 1.030   pH 6.0  5.0 - 8.0   Glucose, UA NEGATIVE  NEGATIVE mg/dL   Hgb urine dipstick TRACE (*) NEGATIVE   Bilirubin Urine NEGATIVE  NEGATIVE   Ketones, ur TRACE (*) NEGATIVE mg/dL   Protein, ur 30 (*) NEGATIVE mg/dL    Urobilinogen, UA 0.2  0.0 - 1.0 mg/dL   Nitrite NEGATIVE  NEGATIVE   Leukocytes, UA NEGATIVE  NEGATIVE  URINE MICROSCOPIC-ADD ON      Component Value Range   RBC / HPF 0-2  <3 RBC/hpf   Bacteria, UA RARE  RARE   Urine-Other MUCOUS PRESENT    CBC      Component Value Range   WBC 13.1 (*) 4.0 - 10.5 K/uL   RBC 4.44  3.87 - 5.11 MIL/uL   Hemoglobin 13.4  12.0 - 15.0 g/dL   HCT 04.5  40.9 - 81.1 %   MCV 85.7  78.0 - 100.0 fL   MCH 30.2  26.0 - 34.0 pg   MCHC 35.3  30.0 - 36.0 g/dL   RDW 91.4  78.2 - 95.6 %   Platelets 317  150 - 400 K/uL  DIFFERENTIAL      Component Value Range   Neutrophils Relative 80 (*) 43 - 77 %   Neutro Abs 10.4 (*) 1.7 - 7.7 K/uL   Lymphocytes Relative 12  12 - 46 %   Lymphs Abs 1.6  0.7 - 4.0 K/uL   Monocytes Relative 7  3 - 12 %   Monocytes Absolute 0.9  0.1 - 1.0 K/uL   Eosinophils Relative 1  0 - 5 %   Eosinophils Absolute 0.1  0.0 - 0.7 K/uL   Basophils Relative 0  0 - 1 %   Basophils Absolute 0.0  0.0 - 0.1 K/uL  D-DIMER, QUANTITATIVE      Component Value Range   D-Dimer, Quant    0.00 - 0.48 ug/mL-FEU   Value: <0.22            AT THE INHOUSE ESTABLISHED CUTOFF     VALUE OF 0.48 ug/mL FEU,     THIS ASSAY HAS BEEN DOCUMENTED     IN THE LITERATURE TO HAVE     A SENSITIVITY AND NEGATIVE     PREDICTIVE VALUE OF AT LEAST     98 TO 99%.  THE TEST RESULT     SHOULD BE CORRELATED WITH     AN ASSESSMENT OF THE CLINICAL     PROBABILITY OF DVT / VTE.  HEPATIC FUNCTION PANEL      Component Value Range   Total Protein 8.2  6.0 - 8.3 g/dL   Albumin 4.6  3.5 - 5.2 g/dL   AST 23  0 - 37 U/L   ALT 27  0 - 35 U/L   Alkaline Phosphatase 65  39 - 117 U/L   Total Bilirubin 0.8  0.3 - 1.2 mg/dL   Bilirubin, Direct 0.2  0.0 - 0.3 mg/dL   Indirect Bilirubin 0.6  0.3 - 0.9 mg/dL  LIPASE, BLOOD      Component Value Range   Lipase 31  11 - 59 U/L  PROTIME-INR      Component Value Range   Prothrombin Time 14.2  11.6 - 15.2 seconds   INR 1.08  0.00 - 1.49    POCT I-STAT, CHEM 8      Component Value Range   Sodium 140  135 - 145 mEq/L   Potassium 4.2  3.5 - 5.1 mEq/L   Chloride 111  96 - 112 mEq/L   BUN 16  6 - 23 mg/dL   Creatinine, Ser 0.8  0.4 - 1.2 mg/dL   Glucose, Bld 454 (*) 70 - 99 mg/dL   Calcium, Ion 0.98 (*) 1.12 - 1.32 mmol/L   TCO2 21  0 - 100 mmol/L   Hemoglobin 14.3  12.0 - 15.0 g/dL   HCT 11.9  14.7 - 82.9 %  POCT CARDIAC MARKERS      Component Value Range   Myoglobin, poc 80.3  12 - 200 ng/mL   CKMB, poc <1.0 (*) 1.0 - 8.0 ng/mL   Troponin i, poc <0.05  0.00 - 0.09 ng/mL   Comment       Value:            TROPONIN VALUES IN THE RANGE     OF 0.00-0.09 ng/mL SHOW     NO INDICATION OF     MYOCARDIAL INJURY.                PERSISTENTLY INCREASED TROPONIN     VALUES IN THE RANGE OF 0.10-0.24     ng/mL CAN BE SEEN IN:           -UNSTABLE ANGINA           -CONGESTIVE HEART FAILURE           -MYOCARDITIS           -CHEST TRAUMA           -ARRYHTHMIAS           -LATE PRESENTING MI           -COPD       CLINICAL FOLLOW-UP RECOMMENDED.                TROPONIN VALUES >=0.25 ng/mL     INDICATE POSSIBLE MYOCARDIAL     ISCHEMIA. SERIAL TESTING     RECOMMENDED.  POCT PREGNANCY, URINE      Component Value Range   Preg Test, Ur       Value: NEGATIVE            THE SENSITIVITY OF THIS     METHODOLOGY IS >24 mIU/mL  POCT CARDIAC MARKERS      Component Value Range   Myoglobin, poc 149  12 - 200 ng/mL   CKMB, poc 1.7  1.0 - 8.0 ng/mL   Troponin i, poc <0.05  0.00 - 0.09 ng/mL   Comment       Value:            TROPONIN VALUES IN THE RANGE     OF 0.00-0.09 ng/mL SHOW     NO INDICATION OF     MYOCARDIAL INJURY.                PERSISTENTLY INCREASED TROPONIN     VALUES IN THE  RANGE OF 0.10-0.24     ng/mL CAN BE SEEN IN:           -UNSTABLE ANGINA           -CONGESTIVE HEART FAILURE           -MYOCARDITIS           -CHEST TRAUMA           -ARRYHTHMIAS           -LATE PRESENTING MI           -COPD       CLINICAL  FOLLOW-UP RECOMMENDED.                TROPONIN VALUES >=0.25 ng/mL     INDICATE POSSIBLE MYOCARDIAL     ISCHEMIA. SERIAL TESTING     RECOMMENDED.   Dg Ribs Unilateral W/chest Left  06/02/2012  *RADIOLOGY REPORT*  Clinical Data: Post fall, now with left-sided rib pain.  An area demarcated by a BB.  LEFT RIBS AND CHEST - 3+ VIEW  Comparison: Right-sided rib radiographic series - 05/09/2011  Findings:  Grossly unchanged cardiac silhouette and mediastinal contours with mild tortuosity of the thoracic aorta.  There is persistent mild elevation of the right hemidiaphragm.  Linear left basilar peripheral heterogeneous opacities are unchanged favored to represent subsegmental atelectasis or scar.  No new focal airspace opacities.  No definite pleural effusion or pneumothorax.  There are possible minimally displaced fractures of the anterior aspects of the left second through fourth ribs.  Post cholecystectomy.  IMPRESSION: 1.  Possible minimally displaced fractures of the anterior aspects of the left second through fourth ribs.  Correlation for point tenderness at these locations is recommended. 2.  Unchanged cardiomegaly and left lower lung peripheral linear heterogeneous opacities favored to represent atelectasis or scar.   Original Report Authenticated By: Waynard Reeds, M.D.      LACERATION REPAIR Performed by: Fayrene Helper Authorized byFayrene Helper Consent: Verbal consent obtained. Risks and benefits: risks, benefits and alternatives were discussed Consent given by: patient Patient identity confirmed: provided demographic data Prepped and Draped in normal sterile fashion Wound explored  Laceration Location: L parietal scalp  Laceration Length: 2cm  No Foreign Bodies seen or palpated  Anesthesia: local infiltration  Local anesthetic: lidocaine 2% w epinephrine  Anesthetic total: 3 ml  Irrigation method: syringe Amount of cleaning: standard  Skin closure: surgical staples  Number of  staples: 3  Technique: surgical staple  Patient tolerance: Patient tolerated the procedure well with no immediate complications.  1. Mechanical fall 2. Scalp laceration 3. Ribs fracture, L  MDM  Pt with hx of stiffman syndrome suffered a mechanical fall today and suffered head lac.  Pt decline head CT.  Does have pain to L ribs, worsen since last fall.  Will obtain rib xray.  Head wound cleansed and stapled.    11:49 AM Scalp were cleansed and irrigated. Hair trimmed, wound stapled.  Staples to be removed in 7 days.  Strict return precaution for signs of infection were discussed.    Pt has 3 minimally displaced L ribs fx (2nd-4th).  No pneumothorax or hemothorax noted.  Normal sats.  Definitive fracture care including pain medication, incentive spirometry, care instruction, and warning of risk of pneumonia were discussed.  Pt aware of plan.  Pt to f/u with PCP for further care.   Pt able to ambulate.  Recommend care for suspected concussive syndrome.  Discussed with my attending.    BP  140/87  Pulse 81  Temp 98.7 F (37.1 C) (Oral)  Resp 20  Ht 5\' 5"  (1.651 m)  Wt 230 lb (104.327 kg)  BMI 38.27 kg/m2  SpO2 95%  Nursing notes reviewed and considered in documentation  Previous records reviewed and considered  All labs/vitals reviewed and considered  xrays reviewed and considered   Fayrene Helper, PA-C 06/02/12 1205

## 2012-06-05 NOTE — ED Provider Notes (Signed)
Medical screening examination/treatment/procedure(s) were performed by non-physician practitioner and as supervising physician I was immediately available for consultation/collaboration.  Jamareon Shimel, MD 06/05/12 1058 

## 2013-03-10 ENCOUNTER — Ambulatory Visit: Payer: BC Managed Care – PPO | Admitting: Neurology

## 2014-03-15 ENCOUNTER — Emergency Department (HOSPITAL_COMMUNITY): Payer: Medicare Other

## 2014-03-15 ENCOUNTER — Emergency Department (HOSPITAL_COMMUNITY)
Admission: EM | Admit: 2014-03-15 | Discharge: 2014-03-15 | Disposition: A | Discharge status: Home / Self Care | Payer: Medicare Other | Attending: Emergency Medicine | Admitting: Emergency Medicine

## 2014-03-15 ENCOUNTER — Encounter (HOSPITAL_COMMUNITY): Payer: Self-pay | Admitting: Emergency Medicine

## 2014-03-15 DIAGNOSIS — G2582 Stiff-man syndrome: Secondary | ICD-10-CM | POA: Insufficient documentation

## 2014-03-15 DIAGNOSIS — S199XXA Unspecified injury of neck, initial encounter: Secondary | ICD-10-CM

## 2014-03-15 DIAGNOSIS — W1809XA Striking against other object with subsequent fall, initial encounter: Secondary | ICD-10-CM | POA: Insufficient documentation

## 2014-03-15 DIAGNOSIS — Y92009 Unspecified place in unspecified non-institutional (private) residence as the place of occurrence of the external cause: Secondary | ICD-10-CM | POA: Insufficient documentation

## 2014-03-15 DIAGNOSIS — Z791 Long term (current) use of non-steroidal anti-inflammatories (NSAID): Secondary | ICD-10-CM | POA: Insufficient documentation

## 2014-03-15 DIAGNOSIS — Z794 Long term (current) use of insulin: Secondary | ICD-10-CM | POA: Insufficient documentation

## 2014-03-15 DIAGNOSIS — S0003XA Contusion of scalp, initial encounter: Secondary | ICD-10-CM | POA: Insufficient documentation

## 2014-03-15 DIAGNOSIS — S0993XA Unspecified injury of face, initial encounter: Secondary | ICD-10-CM | POA: Insufficient documentation

## 2014-03-15 DIAGNOSIS — S0093XA Contusion of unspecified part of head, initial encounter: Secondary | ICD-10-CM

## 2014-03-15 DIAGNOSIS — S0083XA Contusion of other part of head, initial encounter: Principal | ICD-10-CM | POA: Insufficient documentation

## 2014-03-15 DIAGNOSIS — Z87891 Personal history of nicotine dependence: Secondary | ICD-10-CM | POA: Insufficient documentation

## 2014-03-15 DIAGNOSIS — Y9389 Activity, other specified: Secondary | ICD-10-CM | POA: Insufficient documentation

## 2014-03-15 DIAGNOSIS — S0990XA Unspecified injury of head, initial encounter: Secondary | ICD-10-CM | POA: Diagnosis present

## 2014-03-15 DIAGNOSIS — Z79899 Other long term (current) drug therapy: Secondary | ICD-10-CM | POA: Insufficient documentation

## 2014-03-15 DIAGNOSIS — S1093XA Contusion of unspecified part of neck, initial encounter: Secondary | ICD-10-CM | POA: Diagnosis not present

## 2014-03-15 DIAGNOSIS — W19XXXA Unspecified fall, initial encounter: Secondary | ICD-10-CM

## 2014-03-15 DIAGNOSIS — I10 Essential (primary) hypertension: Secondary | ICD-10-CM | POA: Diagnosis not present

## 2014-03-15 LAB — BASIC METABOLIC PANEL
ANION GAP: 13 (ref 5–15)
BUN: 9 mg/dL (ref 6–23)
CALCIUM: 10 mg/dL (ref 8.4–10.5)
CO2: 28 meq/L (ref 19–32)
CREATININE: 0.43 mg/dL — AB (ref 0.50–1.10)
Chloride: 97 mEq/L (ref 96–112)
GFR calc Af Amer: >90 mL/min (ref >90)
GFR calc non Af Amer: >90 mL/min (ref >90)
Glucose, Bld: 301 mg/dL — ABNORMAL HIGH (ref 70–99)
Potassium: 4.1 mEq/L (ref 3.7–5.3)
Sodium: 138 mEq/L (ref 137–147)

## 2014-03-15 LAB — CBC
HEMATOCRIT: 40.3 % (ref 36.0–46.0)
HEMOGLOBIN: 13.4 g/dL (ref 12.0–15.0)
MCH: 28.9 pg (ref 26.0–34.0)
MCHC: 33.3 g/dL (ref 30.0–36.0)
MCV: 86.9 fL (ref 78.0–100.0)
Platelets: 286 10*3/uL (ref 150–400)
RBC: 4.64 MIL/uL (ref 3.87–5.11)
RDW: 12.4 % (ref 11.5–15.5)
WBC: 11 10*3/uL — ABNORMAL HIGH (ref 4.0–10.5)

## 2014-03-15 MED ORDER — OXYCODONE-ACETAMINOPHEN 5-325 MG PO TABS: ORAL_TABLET | ORAL | Status: DC

## 2014-03-15 MED ORDER — MORPHINE SULFATE 4 MG/ML IJ SOLN
4.0000 mg | Freq: Once | INTRAMUSCULAR | Status: AC
  Administered 2014-03-15: 4 mg via INTRAMUSCULAR
  Filled 2014-03-15: qty 1

## 2014-03-15 MED ORDER — OXYCODONE-ACETAMINOPHEN 5-325 MG PO TABS
1.0000 | ORAL_TABLET | Freq: Once | ORAL | Status: AC
  Administered 2014-03-15: 1 via ORAL
  Filled 2014-03-15: qty 1

## 2014-03-15 MED ORDER — DIAZEPAM 5 MG PO TABS
5.0000 mg | ORAL_TABLET | Freq: Once | ORAL | Status: AC
  Administered 2014-03-15: 5 mg via ORAL
  Filled 2014-03-15: qty 1

## 2014-03-15 NOTE — ED Provider Notes (Signed)
CSN: [id number]     Arrival date & time 03/15/14  1626 History   First MD Initiated Contact with Patient 03/15/14 1949     Chief Complaint  Patient presents with  . Fall  . Head Injury     (Consider location/radiation/quality/duration/timing/severity/associated sxs/prior Treatment) HPI  Amado NashKimberly A Menna is a 48 y.o. female complaining of severe headache status post fall at home. Patient has past medical history significant for stiff person syndrome was watching her daughter's dogs and they knocked her over she fell backwards and hit the occipital area of her head on the hardwood floor. She endorses neck pain moderately severe as well. Denies  LOC/syncope, change in vision, N/V, numbness, weakness, dysarthria, ataxia, chest pain, shortness of breath, abdominal pain, other joint pain.   Past Medical History  Diagnosis Date  . Stiffman syndrome   . Hypertension    Past Surgical History  Procedure Laterality Date  . Cholecystectomy    . Appendectomy    . Hernia repair    . Cesarean section     History reviewed. No pertinent family history. History  Substance Use Topics  . Smoking status: Former Games developermoker  . Smokeless tobacco: Not on file  . Alcohol Use: No   OB History   Grav Para Term Preterm Abortions TAB SAB Ect Mult Living                 Review of Systems  10 systems reviewed and found to be negative, except as noted in the HPI.   Allergies  Review of patient's allergies indicates no known allergies.  Home Medications   Prior to Admission medications   Medication Sig Start Date End Date Taking? Authorizing Provider  baclofen (LIORESAL) 10 MG tablet Take 10 mg by mouth daily.   Yes Historical Provider, MD  bisoprolol (ZEBETA) 5 MG tablet Take 2.5 mg by mouth every evening.   Yes Historical Provider, MD  diazepam (VALIUM) 10 MG tablet Take 10 mg by mouth every 6 (six) hours as needed for anxiety. Take 1 tablet by mouth two to three times per day   Yes Historical  Provider, MD  insulin detemir (LEVEMIR) 100 UNIT/ML injection Inject 104 Units into the skin at bedtime.   Yes Historical Provider, MD  levocetirizine (XYZAL) 5 MG tablet Take 5 mg by mouth every evening.   Yes Historical Provider, MD  medroxyPROGESTERone (DEPO-PROVERA) 150 MG/ML injection Inject 150 mg into the muscle every 3 (three) months.   Yes Historical Provider, MD  Multiple Vitamin (MULTIVITAMIN WITH MINERALS) TABS Take 1 tablet by mouth daily.   Yes Historical Provider, MD  naproxen (NAPROSYN) 500 MG tablet Take 500 mg by mouth daily.    Yes Historical Provider, MD  sertraline (ZOLOFT) 50 MG tablet Take 50 mg by mouth daily.   Yes Historical Provider, MD  simvastatin (ZOCOR) 20 MG tablet Take 20 mg by mouth daily at 6 PM.   Yes Historical Provider, MD  oxyCODONE-acetaminophen (PERCOCET/ROXICET) 5-325 MG per tablet 1 to 2 tabs PO q6hrs  PRN for pain 03/15/14   Joni ReiningNicole Emersynn Deatley, PA-C   BP 126/81  Pulse 93  Temp(Src) 97.2 F (36.2 C) (Oral)  Resp 14  SpO2 94% Physical Exam  Nursing note and vitals reviewed. Constitutional: She is oriented to person, place, and time. She appears well-developed and well-nourished. No distress.  HENT:  Head: Normocephalic and atraumatic.  Mouth/Throat: Oropharynx is clear and moist.  Large contusion to left occipital area   Eyes: Conjunctivae and EOM  are normal. Pupils are equal, round, and reactive to light.  Neck: Normal range of motion. Neck supple.  Positive midline tenderness to palpation with no step-offs  Cardiovascular: Normal rate, regular rhythm and intact distal pulses.   Pulmonary/Chest: Effort normal and breath sounds normal. No stridor. No respiratory distress. She has no wheezes. She has no rales. She exhibits no tenderness.  No TTP or crepitance  Abdominal: Soft. Bowel sounds are normal. She exhibits no distension and no mass. There is no tenderness. There is no rebound and no guarding.  Musculoskeletal: Normal range of motion. She  exhibits no edema and no tenderness.  Pelvis stable. No deformity or TTP of major joints.   Good ROM  Neurological: She is alert and oriented to person, place, and time.  Strength 5/5 x4 extremities   Distal sensation intact  Skin: Skin is warm.  Psychiatric: She has a normal mood and affect.    ED Course  Procedures (including critical care time) Labs Review Labs Reviewed  BASIC METABOLIC PANEL - Abnormal; Notable for the following:    Glucose, Bld 301 (*)    Creatinine, Ser 0.43 (*)    All other components within normal limits  CBC - Abnormal; Notable for the following:    WBC 11.0 (*)    All other components within normal limits    Imaging Review Ct Head Wo Contrast  03/15/2014   CLINICAL DATA:  Head injury after fall.  EXAM: CT HEAD WITHOUT CONTRAST  CT CERVICAL SPINE WITHOUT CONTRAST  TECHNIQUE: Multidetector CT imaging of the head and cervical spine was performed following the standard protocol without intravenous contrast. Multiplanar CT image reconstructions of the cervical spine were also generated.  COMPARISON:  None.  FINDINGS: CT HEAD FINDINGS  Bony calvarium appears intact. No mass effect or midline shift is noted. Ventricular size is within normal limits. There is no evidence of mass lesion, hemorrhage or acute infarction.  CT CERVICAL SPINE FINDINGS  No fracture or spondylolisthesis is noted. Disc spaces are well-maintained. Reversal normal lordosis is noted most likely positional in origin.  IMPRESSION: Normal head CT.  No significant abnormality seen in the cervical spine.   Electronically Signed   By: Roque Lias M.D.   On: 03/15/2014 21:22   Ct Cervical Spine Wo Contrast  03/15/2014   CLINICAL DATA:  Head injury after fall.  EXAM: CT HEAD WITHOUT CONTRAST  CT CERVICAL SPINE WITHOUT CONTRAST  TECHNIQUE: Multidetector CT imaging of the head and cervical spine was performed following the standard protocol without intravenous contrast. Multiplanar CT image  reconstructions of the cervical spine were also generated.  COMPARISON:  None.  FINDINGS: CT HEAD FINDINGS  Bony calvarium appears intact. No mass effect or midline shift is noted. Ventricular size is within normal limits. There is no evidence of mass lesion, hemorrhage or acute infarction.  CT CERVICAL SPINE FINDINGS  No fracture or spondylolisthesis is noted. Disc spaces are well-maintained. Reversal normal lordosis is noted most likely positional in origin.  IMPRESSION: Normal head CT.  No significant abnormality seen in the cervical spine.   Electronically Signed   By: Roque Lias M.D.   On: 03/15/2014 21:22     EKG Interpretation None      MDM   Final diagnoses:  Fall at home, initial encounter  Stiff person syndrome  Head contusion, initial encounter    Filed Vitals:   03/15/14 1752 03/15/14 2139  BP: 149/95 126/81  Pulse: 93 93  Temp: 97.2 F (36.2 C)  TempSrc: Oral   Resp: 16 14  SpO2: 95% 94%    Medications  oxyCODONE-acetaminophen (PERCOCET/ROXICET) 5-325 MG per tablet 1 tablet (1 tablet Oral Given 03/15/14 2018)  morphine 4 MG/ML injection 4 mg (4 mg Intramuscular Given 03/15/14 2145)  diazepam (VALIUM) tablet 5 mg (5 mg Oral Given 03/15/14 2145)    CARIAH SALATINO is a 48 y.o. female presenting with occipital head trauma status post fall. Neuro exam is nonfocal. CT head with no acute findings. CT cervical spine shows return reversal of normal lordosis consistent with muscular spasm.  Evaluation does not show pathology that would require ongoing emergent intervention or inpatient treatment. Pt is hemodynamically stable and mentating appropriately. Discussed findings and plan with patient/guardian, who agrees with care plan. All questions answered. Return precautions discussed and outpatient follow up given.   Discharge Medication List as of 03/15/2014  9:34 PM    START taking these medications   Details  oxyCODONE-acetaminophen (PERCOCET/ROXICET) 5-325 MG per tablet  1 to 2 tabs PO q6hrs  PRN for pain, Print             Wynetta Emery, PA-C 03/16/14 0023

## 2014-03-15 NOTE — ED Notes (Signed)
Pt with Hx "stiff person syndrome" and of multiple falls, reports to ED for fall and head injury with large hematoma to right posterior head, c/o tingling dizziness. Pt states that dog ran into her and knocked her to the ground, denies LOC.

## 2014-03-15 NOTE — Discharge Instructions (Signed)
Take percocet for breakthrough pain, do not drink alcohol, drive, care for children or do other critical tasks while taking percocet. ° °Please follow with your primary care doctor in the next 2 days for a check-up. They must obtain records for further management.  ° °Do not hesitate to return to the Emergency Department for any new, worsening or concerning symptoms.  ° °

## 2014-03-16 NOTE — ED Provider Notes (Signed)
Medical screening examination/treatment/procedure(s) were performed by non-physician practitioner and as supervising physician I was immediately available for consultation/collaboration.   EKG Interpretation None        Lyanne CoKevin M Odelle Kosier, MD 03/16/14 [phone]

## 2014-07-08 ENCOUNTER — Other Ambulatory Visit: Payer: Self-pay | Admitting: Family Medicine

## 2014-07-08 ENCOUNTER — Other Ambulatory Visit (HOSPITAL_COMMUNITY)
Admission: RE | Admit: 2014-07-08 | Discharge: 2014-07-08 | Disposition: A | Discharge status: Home / Self Care | Payer: Medicare Other | Source: Ambulatory Visit | Attending: Family Medicine | Admitting: Family Medicine

## 2014-07-08 DIAGNOSIS — Z124 Encounter for screening for malignant neoplasm of cervix: Secondary | ICD-10-CM | POA: Insufficient documentation

## 2014-07-08 DIAGNOSIS — Z1151 Encounter for screening for human papillomavirus (HPV): Secondary | ICD-10-CM | POA: Insufficient documentation

## 2014-07-12 LAB — CYTOLOGY - PAP

## 2014-10-31 ENCOUNTER — Emergency Department (HOSPITAL_BASED_OUTPATIENT_CLINIC_OR_DEPARTMENT_OTHER)
Admission: EM | Admit: 2014-10-31 | Discharge: 2014-10-31 | Disposition: A | Discharge status: Home / Self Care | Payer: Medicare Other | Attending: Emergency Medicine | Admitting: Emergency Medicine

## 2014-10-31 ENCOUNTER — Encounter (HOSPITAL_BASED_OUTPATIENT_CLINIC_OR_DEPARTMENT_OTHER): Payer: Self-pay | Admitting: *Deleted

## 2014-10-31 ENCOUNTER — Emergency Department (HOSPITAL_BASED_OUTPATIENT_CLINIC_OR_DEPARTMENT_OTHER): Payer: Medicare Other

## 2014-10-31 DIAGNOSIS — Z79899 Other long term (current) drug therapy: Secondary | ICD-10-CM | POA: Insufficient documentation

## 2014-10-31 DIAGNOSIS — S0181XA Laceration without foreign body of other part of head, initial encounter: Secondary | ICD-10-CM | POA: Diagnosis not present

## 2014-10-31 DIAGNOSIS — Z791 Long term (current) use of non-steroidal anti-inflammatories (NSAID): Secondary | ICD-10-CM | POA: Insufficient documentation

## 2014-10-31 DIAGNOSIS — Z8669 Personal history of other diseases of the nervous system and sense organs: Secondary | ICD-10-CM | POA: Diagnosis not present

## 2014-10-31 DIAGNOSIS — Z87891 Personal history of nicotine dependence: Secondary | ICD-10-CM | POA: Diagnosis not present

## 2014-10-31 DIAGNOSIS — Y998 Other external cause status: Secondary | ICD-10-CM | POA: Insufficient documentation

## 2014-10-31 DIAGNOSIS — W01198A Fall on same level from slipping, tripping and stumbling with subsequent striking against other object, initial encounter: Secondary | ICD-10-CM | POA: Insufficient documentation

## 2014-10-31 DIAGNOSIS — I10 Essential (primary) hypertension: Secondary | ICD-10-CM | POA: Insufficient documentation

## 2014-10-31 DIAGNOSIS — S0990XA Unspecified injury of head, initial encounter: Secondary | ICD-10-CM | POA: Diagnosis present

## 2014-10-31 DIAGNOSIS — Y9289 Other specified places as the place of occurrence of the external cause: Secondary | ICD-10-CM | POA: Diagnosis not present

## 2014-10-31 DIAGNOSIS — E119 Type 2 diabetes mellitus without complications: Secondary | ICD-10-CM | POA: Diagnosis not present

## 2014-10-31 DIAGNOSIS — Y9389 Activity, other specified: Secondary | ICD-10-CM | POA: Insufficient documentation

## 2014-10-31 DIAGNOSIS — Z794 Long term (current) use of insulin: Secondary | ICD-10-CM | POA: Diagnosis not present

## 2014-10-31 DIAGNOSIS — W19XXXA Unspecified fall, initial encounter: Secondary | ICD-10-CM

## 2014-10-31 HISTORY — DX: Type 2 diabetes mellitus without complications: E11.9

## 2014-10-31 MED ORDER — OXYCODONE-ACETAMINOPHEN 5-325 MG PO TABS
1.0000 | ORAL_TABLET | Freq: Once | ORAL | Status: AC
  Administered 2014-10-31: 1 via ORAL
  Filled 2014-10-31: qty 1

## 2014-10-31 MED ORDER — LIDOCAINE HCL 2 % IJ SOLN
5.0000 mL | Freq: Once | INTRAMUSCULAR | Status: AC
  Administered 2014-10-31: 100 mg
  Filled 2014-10-31: qty 20

## 2014-10-31 NOTE — Discharge Instructions (Signed)
Facial Laceration  A facial laceration is a cut on the face. These injuries can be painful and cause bleeding. Lacerations usually heal quickly, but they need special care to reduce scarring. DIAGNOSIS  Your health care provider will take a medical history, ask for details about how the injury occurred, and examine the wound to determine how deep the cut is. TREATMENT  Some facial lacerations may not require closure. Others may not be able to be closed because of an increased risk of infection. The risk of infection and the chance for successful closure will depend on various factors, including the amount of time since the injury occurred. The wound may be cleaned to help prevent infection. If closure is appropriate, pain medicines may be given if needed. Your health care provider will use stitches (sutures), wound glue (adhesive), or skin adhesive strips to repair the laceration. These tools bring the skin edges together to allow for faster healing and a better cosmetic outcome. If needed, you may also be given a tetanus shot. HOME CARE INSTRUCTIONS  Only take over-the-counter or prescription medicines as directed by your health care provider.  Follow your health care provider's instructions for wound care. These instructions will vary depending on the technique used for closing the wound. For Sutures:  Keep the wound clean and dry.   If you were given a bandage (dressing), you should change it at least once a day. Also change the dressing if it becomes wet or dirty, or as directed by your health care provider.   Wash the wound with soap and water 2 times a day. Rinse the wound off with water to remove all soap. Pat the wound dry with a clean towel.   After cleaning, apply a thin layer of the antibiotic ointment recommended by your health care provider. This will help prevent infection and keep the dressing from sticking.   You may shower as usual after the first 24 hours. Do not soak the  wound in water until the sutures are removed.   Get your sutures removed as directed by your health care provider. With facial lacerations, sutures should usually be taken out after 4-5 days to avoid stitch marks.   Wait a few days after your sutures are removed before applying any makeup. For Skin Adhesive Strips:  Keep the wound clean and dry.   Do not get the skin adhesive strips wet. You may bathe carefully, using caution to keep the wound dry.   If the wound gets wet, pat it dry with a clean towel.   Skin adhesive strips will fall off on their own. You may trim the strips as the wound heals. Do not remove skin adhesive strips that are still stuck to the wound. They will fall off in time.  For Wound Adhesive:  You may briefly wet your wound in the shower or bath. Do not soak or scrub the wound. Do not swim. Avoid periods of heavy sweating until the skin adhesive has fallen off on its own. After showering or bathing, gently pat the wound dry with a clean towel.   Do not apply liquid medicine, cream medicine, ointment medicine, or makeup to your wound while the skin adhesive is in place. This may loosen the film before your wound is healed.   If a dressing is placed over the wound, be careful not to apply tape directly over the skin adhesive. This may cause the adhesive to be pulled off before the wound is healed.   Avoid   prolonged exposure to sunlight or tanning lamps while the skin adhesive is in place.  The skin adhesive will usually remain in place for 5-10 days, then naturally fall off the skin. Do not pick at the adhesive film.  After Healing: Once the wound has healed, cover the wound with sunscreen during the day for 1 full year. This can help minimize scarring. Exposure to ultraviolet light in the first year will darken the scar. It can take 1-2 years for the scar to lose its redness and to heal completely.  SEEK IMMEDIATE MEDICAL CARE IF:  You have redness, pain, or  swelling around the wound.   You see ayellowish-white fluid (pus) coming from the wound.   You have chills or a fever.  MAKE SURE YOU:  Understand these instructions.  Will watch your condition.  Will get help right away if you are not doing well or get worse. Document Released: 09/27/2004 Document Revised: 06/10/2013 Document Reviewed: 04/02/2013 Exit[employer] Patient Information 2015 [employer]. This information is not intended to replace advice given to you by your health care provider. Make sure you discuss any questions you have with your health care provider.  

## 2014-10-31 NOTE — ED Notes (Signed)
Pt reports she fell and hit head on slate on fire place- denies LOC- bruise, swelling, and lac to right forehead

## 2014-10-31 NOTE — ED Provider Notes (Signed)
CSN: [id number]     Arrival date & time 10/31/14  1545 History  This chart was scribed for [employer]. Rubin Payor, MD by Tonye Royalty, ED Scribe. This patient was seen in room MH01/MH01 and the patient's care was started at 5:02 PM.    Chief Complaint  Patient presents with  . Head Injury   The history is provided by the patient. No language interpreter was used.    HPI Comments: Margaret Boyd is a 49 y.o. female who presents to the Emergency Department complaining of injury to right forehead PTA. She states she has stiff person syndrome and tripped over a basket of towels, falling forehead and striking her head on slate in front of fireplace. She denies LOC. She denies use of blood thinners. She states her tetanus shot is up to date. She denies injury to chest, abdomen, or elsewhere.  Past Medical History  Diagnosis Date  . Stiffman syndrome   . Hypertension   . Diabetes mellitus without complication    Past Surgical History  Procedure Laterality Date  . Cholecystectomy    . Appendectomy    . Hernia repair    . Cesarean section     No family history on file. History  Substance Use Topics  . Smoking status: Former Games developer  . Smokeless tobacco: Not on file  . Alcohol Use: No   OB History    No data available     Review of Systems  Cardiovascular: Negative for chest pain.  Gastrointestinal: Negative for abdominal pain.  Skin: Positive for wound.  Neurological: Positive for headaches.  Hematological: Does not bruise/bleed easily.  All other systems reviewed and are negative.     Allergies  Review of patient's allergies indicates no known allergies.  Home Medications   Prior to Admission medications   Medication Sig Start Date End Date Taking? Authorizing Provider  baclofen (LIORESAL) 10 MG tablet Take 10 mg by mouth daily.   Yes Historical Provider, MD  bisoprolol (ZEBETA) 5 MG tablet Take 2.5 mg by mouth every evening.   Yes Historical Provider, MD  diazepam  (VALIUM) 10 MG tablet Take 10 mg by mouth every 6 (six) hours as needed for anxiety. Take 1 tablet by mouth two to three times per day   Yes Historical Provider, MD  insulin detemir (LEVEMIR) 100 UNIT/ML injection Inject 104 Units into the skin at bedtime.   Yes Historical Provider, MD  levocetirizine (XYZAL) 5 MG tablet Take 5 mg by mouth every evening.   Yes Historical Provider, MD  medroxyPROGESTERone (DEPO-PROVERA) 150 MG/ML injection Inject 150 mg into the muscle every 3 (three) months.   Yes Historical Provider, MD  Multiple Vitamin (MULTIVITAMIN WITH MINERALS) TABS Take 1 tablet by mouth daily.   Yes Historical Provider, MD  naproxen (NAPROSYN) 500 MG tablet Take 500 mg by mouth daily.    Yes Historical Provider, MD  sertraline (ZOLOFT) 50 MG tablet Take 50 mg by mouth daily.   Yes Historical Provider, MD  simvastatin (ZOCOR) 20 MG tablet Take 20 mg by mouth daily at 6 PM.   Yes Historical Provider, MD  oxyCODONE-acetaminophen (PERCOCET/ROXICET) 5-325 MG per tablet 1 to 2 tabs PO q6hrs  PRN for pain 03/15/14   Joni Reining Pisciotta, PA-C   BP 127/78 mmHg  Pulse 98  Temp(Src) 98.4 F (36.9 C) (Oral)  Resp 18  Ht  (1.626 m)  Wt 190 lb (86.183 kg)  BMI 32.60 kg/m2  SpO2 93% Physical Exam  Constitutional: She is  oriented to person, place, and time. She appears well-developed and well-nourished.  HENT:  Head: Normocephalic.  There is a 3cm hematoma over right eyebrow, There is a 1.5cm laceration There is some tenderness underneath, difficult to examine due to pain  Eyes: Conjunctivae and EOM are normal. Pupils are equal, round, and reactive to light.  Neck: Normal range of motion. Neck supple.  Cervical spine nontender  Pulmonary/Chest: Effort normal. She exhibits no tenderness.  Abdominal: Soft. There is no tenderness.  Musculoskeletal: Normal range of motion.  Extremities nontender  Neurological: She is alert and oriented to person, place, and time.  Skin: Skin is warm and dry.   Psychiatric: She has a normal mood and affect.  Nursing note and vitals reviewed.   ED Course  Procedures (including critical care time)  DIAGNOSTIC STUDIES: Oxygen Saturation is 93% on room air, adequate by my interpretation.    COORDINATION OF CARE: 5:05 PM Discussed treatment plan with patient at beside, including CT of her head and sutures to repair he wound. The patient agrees with the plan and has no further questions at this time.  Labs Review Labs Reviewed - No data to display  Imaging Review No results found.   EKG Interpretation None      MDM   Final diagnoses:  None    Patient with forehead laceration after fall. Sutured closed. Negative head CT. Will discharge home. Cervical spine cleared clinically  I personally performed the services described in this documentation, which was scribed in my presence. The recorded information has been reviewed and is accurate.  LACERATION REPAIR Performed by: Billee CashingPICKERING,Erine Phenix R. Authorized by: Billee CashingPICKERING,Aniketh Huberty R. Consent: Verbal consent obtained. Risks and benefits: risks, benefits and alternatives were discussed Consent given by: patient Patient identity confirmed: provided demographic data Prepped and Draped in normal sterile fashion Wound explored  Laceration Location: right forehead  Laceration Length: 1.5cm  No Foreign Bodies seen or palpated  Anesthesia: local infiltration  Local anesthetic: lidocaine 2% without epinephrine  Anesthetic total: 2 ml  Irrigation method: syringe Amount of cleaning: standard  Skin closure: 5-0 Vicryl Rapide   Number of sutures: 4   Technique: Simple interrupted   Patient tolerance: Patient tolerated the procedure well with no immediate complications.   Juliet RudeNathan R. Rubin PayorPickering, MD 10/31/14 1816

## 2014-12-14 ENCOUNTER — Ambulatory Visit
Admission: RE | Admit: 2014-12-14 | Discharge: 2014-12-14 | Disposition: A | Discharge status: Home / Self Care | Payer: Medicare Other | Source: Ambulatory Visit | Attending: Family Medicine | Admitting: Family Medicine

## 2014-12-14 ENCOUNTER — Other Ambulatory Visit: Payer: Self-pay | Admitting: Family Medicine

## 2014-12-14 DIAGNOSIS — S0993XA Unspecified injury of face, initial encounter: Secondary | ICD-10-CM

## 2014-12-17 ENCOUNTER — Other Ambulatory Visit: Payer: Self-pay | Admitting: Family Medicine

## 2014-12-17 DIAGNOSIS — S0993XA Unspecified injury of face, initial encounter: Secondary | ICD-10-CM

## 2014-12-17 DIAGNOSIS — S0990XA Unspecified injury of head, initial encounter: Secondary | ICD-10-CM

## 2014-12-21 ENCOUNTER — Ambulatory Visit
Admission: RE | Admit: 2014-12-21 | Discharge: 2014-12-21 | Disposition: A | Discharge status: Home / Self Care | Payer: Medicare Other | Source: Ambulatory Visit | Attending: Family Medicine | Admitting: Family Medicine

## 2014-12-21 DIAGNOSIS — S0990XA Unspecified injury of head, initial encounter: Secondary | ICD-10-CM

## 2014-12-21 DIAGNOSIS — S0993XA Unspecified injury of face, initial encounter: Secondary | ICD-10-CM

## 2015-08-08 ENCOUNTER — Other Ambulatory Visit: Payer: Self-pay

## 2015-08-08 DIAGNOSIS — Z1231 Encounter for screening mammogram for malignant neoplasm of breast: Secondary | ICD-10-CM

## 2015-09-02 ENCOUNTER — Ambulatory Visit
Admission: RE | Admit: 2015-09-02 | Discharge: 2015-09-02 | Disposition: A | Discharge status: Home / Self Care | Payer: Medicare Other | Source: Ambulatory Visit

## 2015-09-02 DIAGNOSIS — Z1231 Encounter for screening mammogram for malignant neoplasm of breast: Secondary | ICD-10-CM

## 2016-11-09 ENCOUNTER — Ambulatory Visit: Payer: Medicare Other | Admitting: Neurology

## 2016-11-12 ENCOUNTER — Telehealth: Payer: Self-pay | Admitting: Neurology

## 2016-11-15 NOTE — Progress Notes (Signed)
[facility] Neurology Division Clinic Note - Initial Visit   Date: 11/16/16  Margaret Boyd MRN: [id number] DOB: March 20, 1966   Dear Dr. Paulino Rily:  Thank you for your kind referral of Margaret Boyd for consultation of Stiffperson syndrome. Although her history is well known to you, please allow [place] to reiterate it for the purpose of our medical record. The patient was accompanied to the clinic by daugther who also provides collateral information.     History of Present Illness: Margaret Boyd is a 51 y.o. 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    Starting ~2010, she had cholecystectomy following pancreatitis, immediately followed by appendectomy and abdominal hernia repair.  Thereafter, she was carrying an envelope to the post office and suddenly developed tightness in her back and truck, prohibiting her from moving or even sitting down.  The spasm lasted a few minutes and then she was assisted by a elderly lady to her car.  For the following 3-days, she continued to have spells.  The spells recurred, lasting 3-5 minutes, but it may take up to 20-min for her to completely relax and walk. Most of her spasms are in the abdomen and trunk, and her leg will become very stiff.  If she tried to walk in this state, she would fall.    Extreme cold and hot temperatures make her walking worse.  She was referred to neurology locally who felt she had Stiffperson syndrome.  She sought a second opinion at [facility], [place] in 2012 for the same complaints.  NCS/EMG of the right arm and leg was normal.  GAD65 antibody was highly elevated 3450, normal <0.2 consistent with Stiffperson syndrome.  She takes diazepam 10mg  3-4 times per day and baclofen 10mg  daily which controls spasms to once every 1-2 months.   Since being on diazepam, she has noticed improved mobility and less spasms.   Overall, she feels that symptoms are slowly improving because she is having less falls.  She usually needs assistance with walking and uses grocery carts or her daughter for support.  She does not use a cane or walker. In the shower, she uses a shower stool. She does not have numbness/tingling of the legs.  Out-side paper records, electronic medical record, and images have been reviewed where available and summarized as:  Labs 01/24/2011:  HbA1c 6.2, GAD65 3450* remaining paraneoplastic panel was negative, vitamin B12 185, folate 9.0  Labs 05/04/2016:  HbA1c 6.8  Labs 01/15/2014:  GAD65 <1.0  Past Medical History:  Diagnosis Date  . Diabetes mellitus without complication (HCC)   . Hypertension   . Stiffman syndrome     Past Surgical History:  Procedure Laterality Date  . APPENDECTOMY    . CESAREAN SECTION    . CHOLECYSTECTOMY    . HERNIA REPAIR       Medications:  Outpatient Encounter Prescriptions as of 11/16/2016  Medication Sig Note  . baclofen (LIORESAL) 10 MG tablet Take 1 tablet (10 mg total) by mouth 3 (three) times daily.   . bisoprolol (ZEBETA) 5 MG tablet Take 2.5 mg by mouth every evening.   . diazepam (VALIUM) 10 MG tablet Take 10 mg by mouth every 6 (six) hours as needed for anxiety. Take 1 tablet by mouth two to three times per day   . insulin detemir (LEVEMIR) 100 UNIT/ML injection Inject 104 Units into the skin at bedtime.   Marland Kitchen levocetirizine (XYZAL) 5 MG tablet  Take 5 mg by mouth every evening.   . Multiple Vitamin (MULTIVITAMIN WITH MINERALS) TABS Take 1 tablet by mouth daily.   . naproxen (NAPROSYN) 500 MG tablet Take 500 mg by mouth daily.    . sertraline (ZOLOFT) 50 MG tablet Take 50 mg by mouth daily.   . simvastatin (ZOCOR) 20 MG tablet Take 20 mg by mouth daily at 6 PM.   . [DISCONTINUED] baclofen (LIORESAL) 10 MG tablet Take 10 mg by mouth daily.   . medroxyPROGESTERone (DEPO-PROVERA) 150 MG/ML injection Inject 150 mg into the muscle every 3 (three) months.  06/02/2012: Patient does not remember. Thinks end of October next dose  . [DISCONTINUED] oxyCODONE-acetaminophen (PERCOCET/ROXICET) 5-325 MG per tablet 1 to 2 tabs PO q6hrs  PRN for pain (Patient not taking: Reported on 11/16/2016)    No facility-administered encounter medications on file as of 11/16/2016.      Allergies: No Known Allergies  Family History: Family History  Problem Relation Age of Onset  . Heart disease Mother   . Alcohol abuse Father     Social History: Social History  Substance Use Topics  . Smoking status: Former Games developermoker  . Smokeless tobacco: Never Used  . Alcohol use No   Social History   Social History Narrative   Lives with daughter in a one story home.  On disability.     Education: high school.    Review of Systems:  CONSTITUTIONAL: No fevers, chills, night sweats, or weight loss.   EYES: No visual changes or eye pain ENT: No hearing changes.  No history of nose bleeds.   RESPIRATORY: No cough, wheezing and shortness of breath.   CARDIOVASCULAR: Negative for chest pain, and palpitations.   GI: Negative for abdominal discomfort, blood in stools or black stools.  No recent change in bowel habits.   GU:  No history of incontinence.   MUSCLOSKELETAL: No history of joint pain or swelling.  No myalgias.   SKIN: Negative for lesions, rash, and itching.   HEMATOLOGY/ONCOLOGY: Negative for prolonged bleeding, bruising easily, and swollen nodes.  No history of cancer.   ENDOCRINE: Negative for cold or heat intolerance, polydipsia or goiter.   PSYCH:  +depression or anxiety symptoms.   NEURO: As Above.   Vital Signs:  BP 140/88   Pulse 74   Wt 253 lb 4 oz (114.9 kg)   SpO2 92%   BMI 43.47 kg/m     General Medical Exam:   General:  Well appearing, comfortable.   Eyes/ENT: see cranial nerve examination.   Neck: No masses appreciated.  Full range of motion without tenderness.  No carotid bruits. Respiratory:  Clear to auscultation, good air entry  bilaterally.   Cardiac:  Regular rate and rhythm, no murmur.   Extremities:  No deformities, edema, or skin discoloration.  Skin:  No rashes or lesions.  Neurological Exam: MENTAL STATUS including orientation to time, place, person, recent and remote memory, attention span and concentration, language, and fund of knowledge is normal.  Speech is not dysarthric.  CRANIAL NERVES: II:  No visual field defects.  Unremarkable fundi.   III-IV-VI: Pupils equal round and reactive to light.  Normal conjugate, extra-ocular eye movements in all directions of gaze.  No nystagmus.  Mild right ptosis.   V:  Normal facial sensation.   VII:  Normal facial symmetry and movements.  No pathologic facial reflexes.  VIII:  Normal hearing and vestibular function.   IX-X:  Normal palatal movement.   XI:  Normal shoulder shrug and head rotation.   XII:  Normal tongue strength and range of motion, no deviation or fasciculation.  MOTOR:  No atrophy, fasciculations or abnormal movements.  No pronator drift.  Tone is normal.    Right Upper Extremity:    Left Upper Extremity:    Deltoid  5/5   Deltoid  5/5   Biceps  5/5   Biceps  5/5   Triceps  5/5   Triceps  5/5   Wrist extensors  5/5   Wrist extensors  5/5   Wrist flexors  5/5   Wrist flexors  5/5   Finger extensors  5/5   Finger extensors  5/5   Finger flexors  5/5   Finger flexors  5/5   Dorsal interossei  5/5   Dorsal interossei  5/5   Abductor pollicis  5/5   Abductor pollicis  5/5   Tone (Ashworth scale)  0  Tone (Ashworth scale)  0   Right Lower Extremity:    Left Lower Extremity:    Hip flexors  5/5   Hip flexors  5/5   Hip extensors  5/5   Hip extensors  5/5   Knee flexors  5/5   Knee flexors  5/5   Knee extensors  5/5   Knee extensors  5/5   Dorsiflexors  5/5   Dorsiflexors  5/5   Plantarflexors  5/5   Plantarflexors  5/5   Toe extensors  5/5   Toe extensors  5/5   Toe flexors  5/5   Toe flexors  5/5   Tone (Ashworth scale)  0  Tone (Ashworth  scale)  0   MSRs:  Right                                                                 Left brachioradialis 3+  brachioradialis 3+  biceps 3+  biceps 3+  triceps 3+  triceps 3+  patellar 3+  patellar 3+  ankle jerk 2+  ankle jerk 2+  Hoffman no  Hoffman no  plantar response down  plantar response down   SENSORY:  Normal and symmetric perception of light touch, pinprick, vibration, and proprioception.    COORDINATION/GAIT: Normal finger-to- nose-finger.  Intact rapid alternating movements bilaterally.  Unable to rise from a chair without using arms.  Gait is wide-based, stiff, and assisted by one-person.   IMPRESSION: Margaret Boyd is a 51 year-old female referred for my opinion on Stiffperson Syndrome.  She was diagnosed with this in 2012 after having high titer of GAD65 antibody at [facility] and has been managed with diazepam and baclofen.  Overall, her muscle spasms are well-controlled on diazepam 10mg  four times daily and baclofen 10mg  at bedtime, but she continues to have difficulty with gait.  She often relies on someone to walk with her, because this offers stability.  She does not use a cane and is very reluctant to do this.  She is interested in any new updates in the disease and unfortunately, being such a rare condition there have not been any new medications approved for this condition.  We discussed using muscle relaxants as first line therapy.  If her symptoms progress, we can try a course of Solumedrol infusions but at this time,  I do not see that it is warranted.  We also discussed IVIG and plasmapheresis, but I would use these for severe disease, such as if non-ambulatory.   At this juncture, I recommend increasing her baclofen to 10mg  three times daily and continue diazepam 10mg  3-4 times daily.  Physical therapy for stretching was also recommended, but she prefers to do her own home exercises For gait support, she would be more stable and independent using a rollator    Encouraged her to be sure her cancer screening is up to date.  She is due to colonoscopy. Encouraged her to maintain tight glycemic control.  She would like to follow-up with me as needed.  The duration of this appointment visit was 45 minutes of face-to-face time with the patient.  Greater than 50% of this time was spent in counseling, explanation of diagnosis, planning of further management, and coordination of care.   Thank you for allowing me to participate in patient's care.  If I can answer any additional questions, I would be pleased to do so.    Sincerely,    Narcissa Melder K. Allena Katz, DO

## 2016-11-16 ENCOUNTER — Encounter: Payer: Self-pay | Admitting: Neurology

## 2016-11-16 ENCOUNTER — Ambulatory Visit (INDEPENDENT_AMBULATORY_CARE_PROVIDER_SITE_OTHER): Payer: Medicare Other | Admitting: Neurology

## 2016-11-16 VITALS — BP 140/88 | HR 74 | Wt 253.2 lb

## 2016-11-16 DIAGNOSIS — G2582 Stiff-man syndrome: Secondary | ICD-10-CM

## 2016-11-16 HISTORY — DX: Stiff-man syndrome: G25.82

## 2016-11-16 MED ORDER — BACLOFEN 10 MG PO TABS: 10.0000 mg | ORAL_TABLET | Freq: Three times a day (TID) | ORAL | 3 refills | Status: AC

## 2016-11-16 NOTE — Patient Instructions (Addendum)
1.  Start Baclofen 10 mg tablets    Morning       Afternoon        Evening  Week 1 1/2 tab         1/2 tab               1 tab              Week 2 1 tab            1 tab             1 tab              2.  Continue diazepam 10mg  3-4 times daily  3.  Encouraged to start home exercises   4.  Consider using a rollator and getting a shower chair  Return to clinic to as needed

## 2016-11-29 NOTE — Telephone Encounter (Signed)
Error

## 2017-03-22 ENCOUNTER — Other Ambulatory Visit (HOSPITAL_COMMUNITY)
Admission: RE | Admit: 2017-03-22 | Discharge: 2017-03-22 | Disposition: A | Discharge status: Home / Self Care | Payer: Medicare Other | Source: Ambulatory Visit | Attending: Family Medicine | Admitting: Family Medicine

## 2017-03-22 ENCOUNTER — Other Ambulatory Visit: Payer: Self-pay | Admitting: Family Medicine

## 2017-03-22 DIAGNOSIS — Z01411 Encounter for gynecological examination (general) (routine) with abnormal findings: Secondary | ICD-10-CM | POA: Diagnosis present

## 2017-03-25 LAB — CYTOLOGY - PAP: DIAGNOSIS: NEGATIVE

## 2017-08-07 ENCOUNTER — Other Ambulatory Visit: Payer: Self-pay | Admitting: Family Medicine

## 2017-08-07 DIAGNOSIS — K439 Ventral hernia without obstruction or gangrene: Secondary | ICD-10-CM

## 2017-08-09 ENCOUNTER — Other Ambulatory Visit: Payer: Self-pay | Admitting: Family Medicine

## 2017-08-09 DIAGNOSIS — K469 Unspecified abdominal hernia without obstruction or gangrene: Secondary | ICD-10-CM

## 2017-08-16 ENCOUNTER — Other Ambulatory Visit: Payer: Medicare Other

## 2017-09-13 ENCOUNTER — Ambulatory Visit
Admission: RE | Admit: 2017-09-13 | Discharge: 2017-09-13 | Disposition: A | Discharge status: Home / Self Care | Payer: Medicare Other | Source: Ambulatory Visit | Attending: Family Medicine | Admitting: Family Medicine

## 2017-09-13 DIAGNOSIS — K469 Unspecified abdominal hernia without obstruction or gangrene: Secondary | ICD-10-CM

## 2018-10-08 IMAGING — CT CT ABD-PELV W/O CM
1 of 2 series · 14 of 32 positions shown, 18 images · non-contrast
Comparison: 09/10/2008

CLINICAL DATA: Umbilical hernia surgery.  Evaluate for recurrence.

EXAM:
CT ABDOMEN AND PELVIS WITHOUT CONTRAST
TECHNIQUE: Multidetector CT imaging of the abdomen and pelvis was performed
following the standard protocol without IV contrast.

[Series 2: abd/pelvis w/(date) · axial · 0.97mm/px · z∈[-491,-36]mm · 14 of 101 slices shown, 18 images]
[im 5/101  soft-tissue]
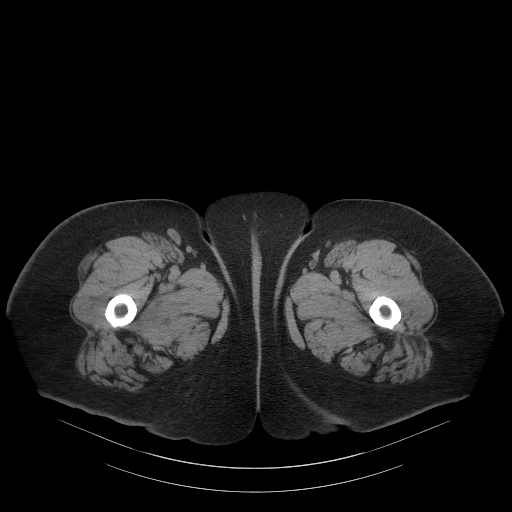
[im 5/101  bone]
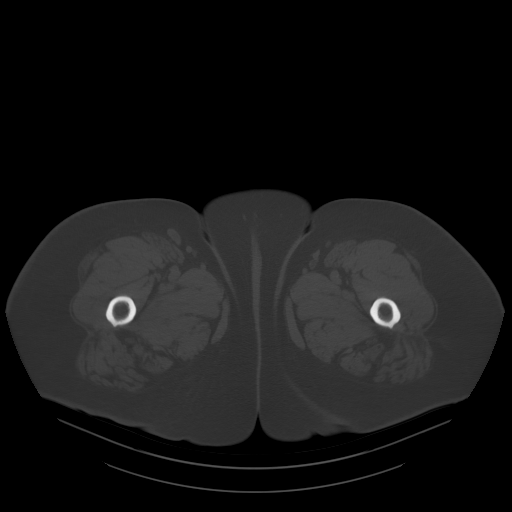
[im 13/101  soft-tissue]
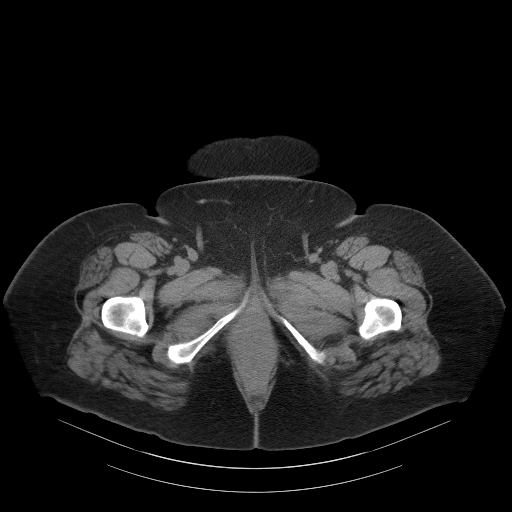
[im 21/101  soft-tissue]
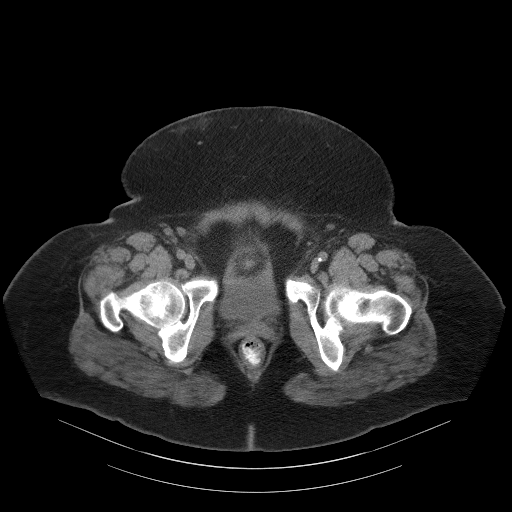
[im 30/101  soft-tissue]
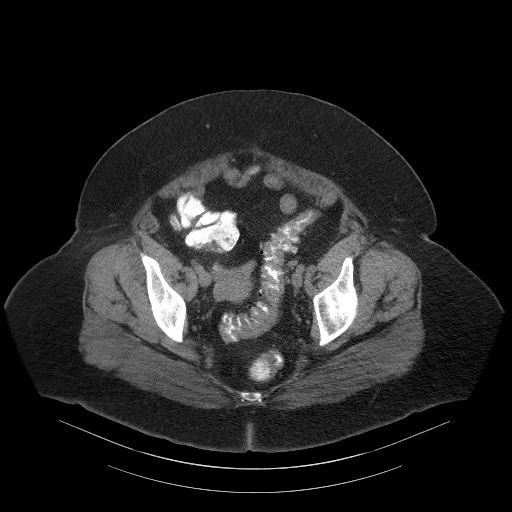
[im 38/101  soft-tissue]
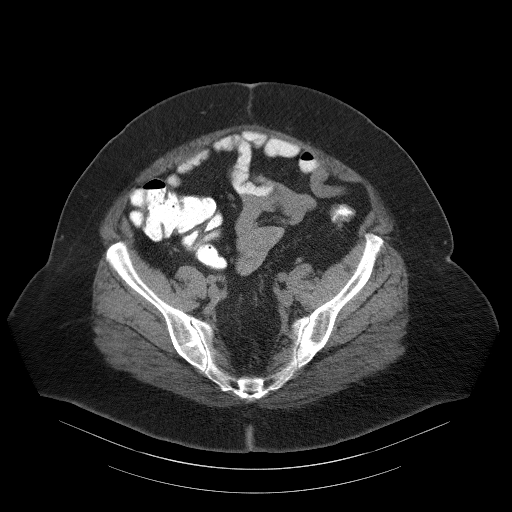
[im 46/101  soft-tissue]
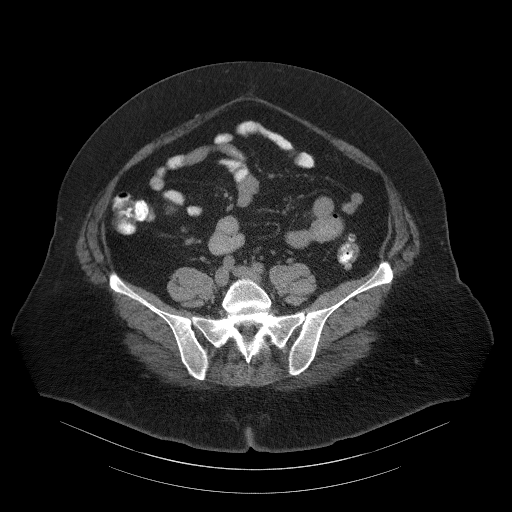
[im 55/101  soft-tissue]
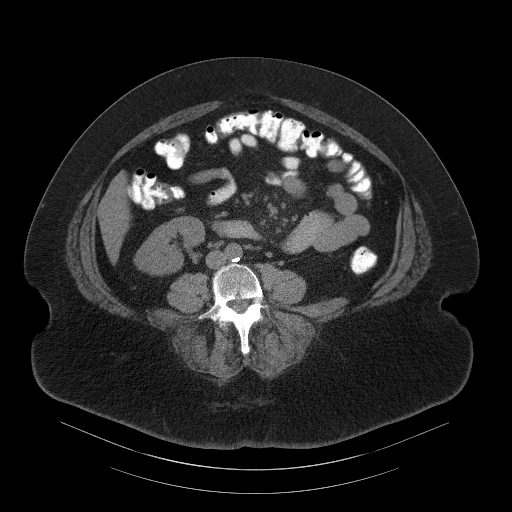
[im 63/101  soft-tissue]
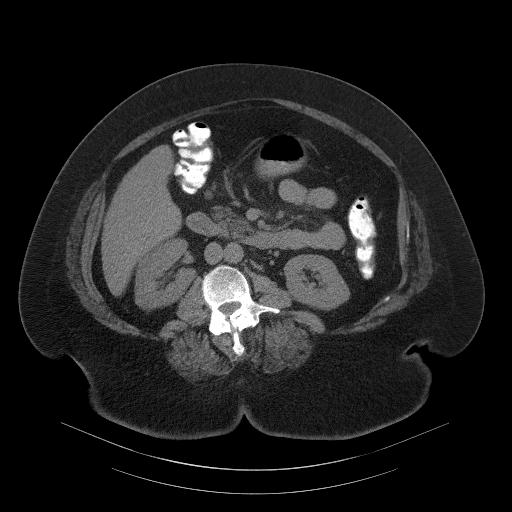
[im 71/101  soft-tissue]
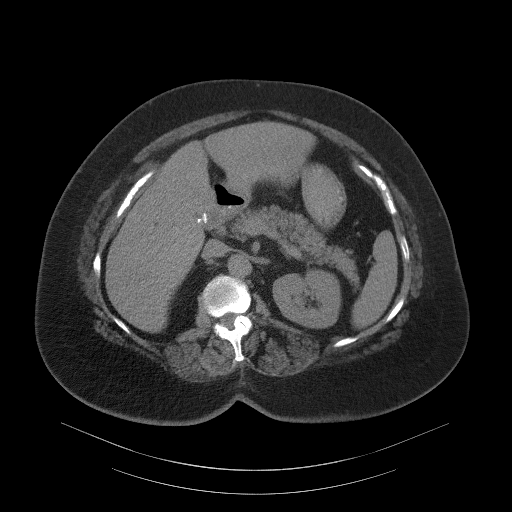
[im 71/101  bone]
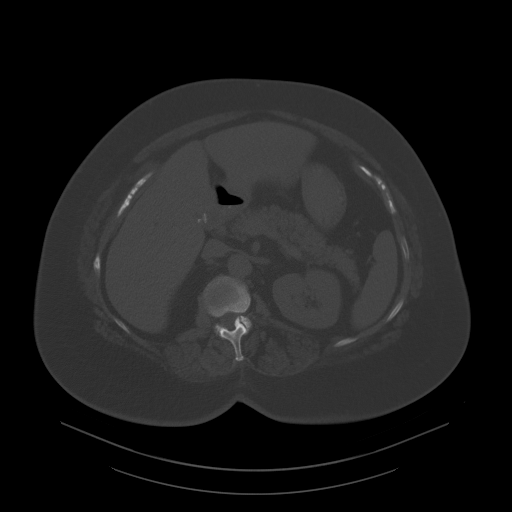
[im 80/101  soft-tissue]
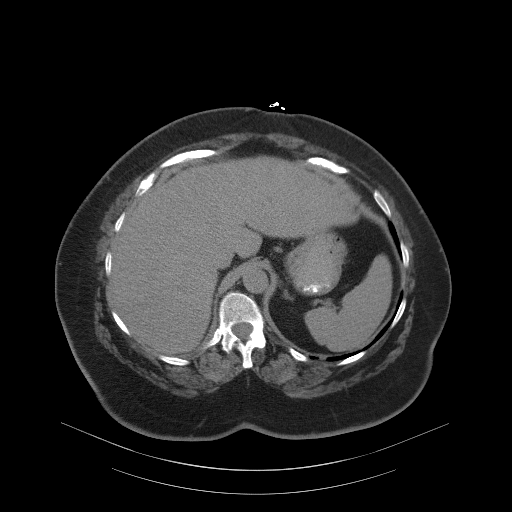
[im 84/101  lung]
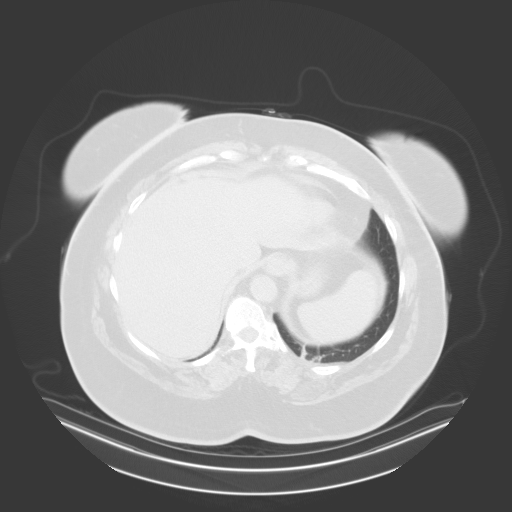
[im 88/101  soft-tissue]
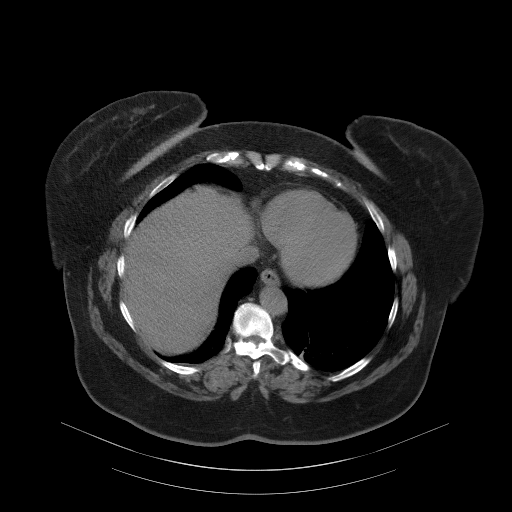
[im 88/101  lung]
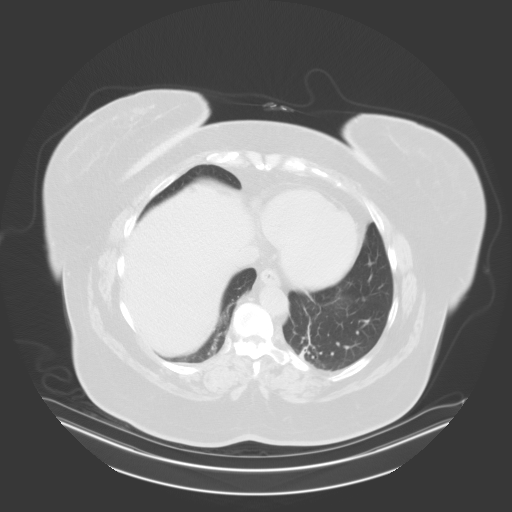
[im 92/101  lung]
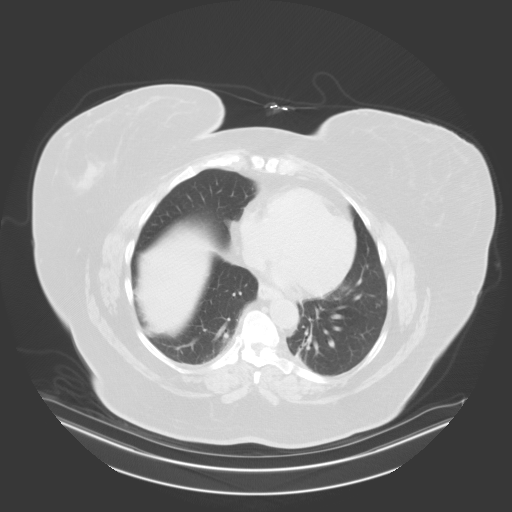
[im 96/101  soft-tissue]
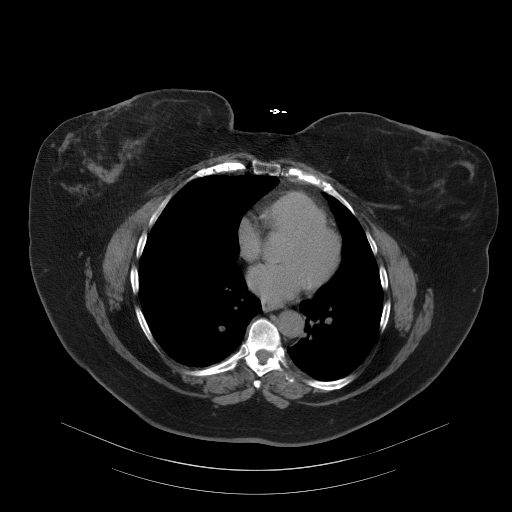
[im 96/101  lung]
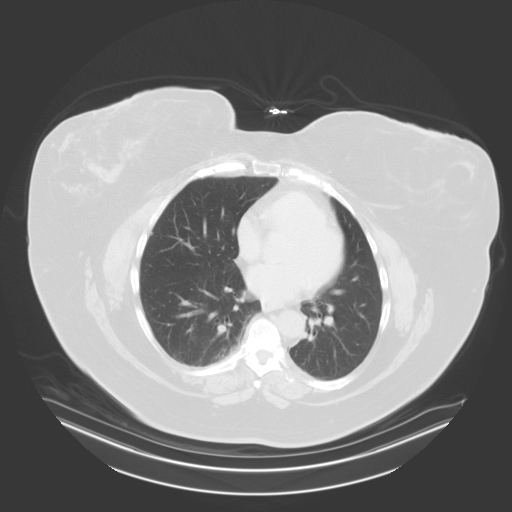

[14 of 32 positions shown; findings below may reference images not displayed]

FINDINGS: Lack of intravenous contrast limits ability to evaluate the solid
abdominal organs.

Lower chest: Limited visualization demonstrates minimal subsegmental
atelectasis within the bilateral lung bases. No pleural effusion.

Normal heart size.  No pericardial effusion.

Hepatobiliary: Normal patent contour. There is diffuse decreased
attenuation hepatic parenchyma suggestive of fatty steatosis. Post
cholecystectomy. No ascites.

Pancreas: Normal non-contrast appearance of the pancreas.

Spleen: Normal non-contrast appearance of the spleen.

Adrenals/Urinary Tract: Normal non-contrast appearance of the
bilateral kidneys. No renal stones. No urinary obstruction or
perinephric stranding. Normal appearance of the urinary bladder
given degree of distention.

Normal noncontrast appearance to the bilateral adrenal glands.

Normal noncontrast appearance of the urinary bladder given
underdistention.

Stomach/Bowel: Ingested enteric contrast extends to the level of the
rectum. Colonic diverticulosis without evidence of diverticulitis on
this noncontrast examination. Normal noncontrast appearance of the
terminal ileum. The appendix is not visualized compatible with
provided operative history.

No pneumoperitoneum, pneumatosis or portal venous gas.

Vascular/Lymphatic: Atherosclerotic plaque within a normal caliber
abdominal aorta.

No bulky retroperitoneal, mesenteric, pelvic or inguinal
lymphadenopathy.

Reproductive: Normal non-contrast appearance of the pelvic organs.
No free fluid in the pelvic cul de sac.

Other: There is diastasis of the midline of the abdominal rectus
musculature with adjacent wide necked ventral abdominal wall
mesenteric fat containing hernias with dominant hernia measuring
approximately 4.1 x 6.0 x 1.3 cm (sagittal image 83, series 4, axial
image 49, series 2) and adjacent hernia measuring 4.6 x 4.1 x 1.1 cm
(axial image 58, series 2; sagittal image 83, series 4).

Musculoskeletal: Mild-to-moderate sclerotic curvature of the
thoracolumbar spine with dominant caudal component convex to the
right.
IMPRESSION: 1. Adjacent wide neck midline ventral wall abdominal hernias as
detailed above.
2. Colonic diverticulosis without evidence of diverticulitis.
3.  Aortic Atherosclerosis ([id number]-[id number]).

## 2020-02-02 DEATH — deceased
